# Patient Record
Sex: Female | Born: 1985 | Race: White | Hispanic: No | Marital: Married | Smoking: Former smoker
Health system: Southern US, Community
[De-identification: ages and names within clinical notes are randomized; demographics above are authoritative.]

## PROBLEM LIST (undated history)

## (undated) ENCOUNTER — Inpatient Hospital Stay (HOSPITAL_COMMUNITY): Payer: Self-pay

## (undated) DIAGNOSIS — R87619 Unspecified abnormal cytological findings in specimens from cervix uteri: Secondary | ICD-10-CM

## (undated) DIAGNOSIS — IMO0002 Reserved for concepts with insufficient information to code with codable children: Secondary | ICD-10-CM

## (undated) DIAGNOSIS — Z3490 Encounter for supervision of normal pregnancy, unspecified, unspecified trimester: Principal | ICD-10-CM

## (undated) DIAGNOSIS — D649 Anemia, unspecified: Secondary | ICD-10-CM

## (undated) HISTORY — DX: Encounter for supervision of normal pregnancy, unspecified, unspecified trimester: Z34.90

## (undated) HISTORY — PX: TONSILLECTOMY: SUR1361

## (undated) HISTORY — DX: Unspecified abnormal cytological findings in specimens from cervix uteri: R87.619

## (undated) HISTORY — DX: Reserved for concepts with insufficient information to code with codable children: IMO0002

---

## 2007-10-14 ENCOUNTER — Ambulatory Visit (HOSPITAL_COMMUNITY): Payer: Self-pay | Admitting: Psychiatry

## 2007-10-22 ENCOUNTER — Ambulatory Visit (HOSPITAL_COMMUNITY): Payer: Self-pay | Admitting: Psychiatry

## 2007-10-25 ENCOUNTER — Ambulatory Visit (HOSPITAL_COMMUNITY): Payer: Self-pay | Admitting: Psychology

## 2008-02-07 ENCOUNTER — Ambulatory Visit: Payer: Self-pay | Admitting: Family

## 2008-02-07 ENCOUNTER — Encounter: Payer: Self-pay | Admitting: Family

## 2008-02-28 ENCOUNTER — Ambulatory Visit: Payer: Self-pay | Admitting: Family

## 2008-03-02 ENCOUNTER — Ambulatory Visit (HOSPITAL_COMMUNITY): Admission: RE | Admit: 2008-03-02 | Discharge: 2008-03-02 | Payer: Self-pay | Admitting: Obstetrics & Gynecology

## 2008-03-27 ENCOUNTER — Ambulatory Visit: Payer: Self-pay | Admitting: Obstetrics and Gynecology

## 2008-04-14 ENCOUNTER — Ambulatory Visit: Payer: Self-pay | Admitting: Family

## 2008-04-18 ENCOUNTER — Ambulatory Visit: Payer: Self-pay | Admitting: Family

## 2008-04-25 ENCOUNTER — Ambulatory Visit: Payer: Self-pay | Admitting: Physician Assistant

## 2008-05-10 ENCOUNTER — Ambulatory Visit: Payer: Self-pay | Admitting: Obstetrics & Gynecology

## 2008-05-24 ENCOUNTER — Ambulatory Visit: Payer: Self-pay | Admitting: Obstetrics & Gynecology

## 2008-06-07 ENCOUNTER — Ambulatory Visit: Payer: Self-pay | Admitting: Obstetrics & Gynecology

## 2008-06-12 ENCOUNTER — Inpatient Hospital Stay (HOSPITAL_COMMUNITY): Admission: AD | Admit: 2008-06-12 | Discharge: 2008-06-12 | Payer: Self-pay | Admitting: Obstetrics & Gynecology

## 2008-06-12 ENCOUNTER — Ambulatory Visit: Payer: Self-pay | Admitting: Family

## 2008-06-26 ENCOUNTER — Ambulatory Visit: Payer: Self-pay | Admitting: Family

## 2008-07-10 ENCOUNTER — Ambulatory Visit: Payer: Self-pay | Admitting: Family

## 2008-07-18 ENCOUNTER — Ambulatory Visit: Payer: Self-pay | Admitting: Family

## 2008-07-25 ENCOUNTER — Ambulatory Visit: Payer: Self-pay | Admitting: Obstetrics and Gynecology

## 2008-07-27 ENCOUNTER — Ambulatory Visit: Payer: Self-pay | Admitting: Obstetrics and Gynecology

## 2008-07-27 ENCOUNTER — Inpatient Hospital Stay (HOSPITAL_COMMUNITY): Admission: AD | Admit: 2008-07-27 | Discharge: 2008-07-29 | Payer: Self-pay | Admitting: Physician Assistant

## 2008-09-08 ENCOUNTER — Ambulatory Visit: Payer: Self-pay | Admitting: Physician Assistant

## 2009-02-14 ENCOUNTER — Encounter: Payer: Self-pay | Admitting: Obstetrics & Gynecology

## 2009-02-14 ENCOUNTER — Ambulatory Visit: Payer: Self-pay | Admitting: Obstetrics & Gynecology

## 2010-06-04 ENCOUNTER — Ambulatory Visit: Payer: Self-pay | Admitting: Obstetrics & Gynecology

## 2010-06-05 ENCOUNTER — Encounter: Payer: Self-pay | Admitting: Obstetrics & Gynecology

## 2011-02-04 ENCOUNTER — Ambulatory Visit (INDEPENDENT_AMBULATORY_CARE_PROVIDER_SITE_OTHER): Payer: Managed Care, Other (non HMO) | Admitting: Obstetrics & Gynecology

## 2011-02-04 ENCOUNTER — Other Ambulatory Visit: Payer: Self-pay | Admitting: Obstetrics & Gynecology

## 2011-02-04 DIAGNOSIS — Z113 Encounter for screening for infections with a predominantly sexual mode of transmission: Secondary | ICD-10-CM

## 2011-02-04 DIAGNOSIS — Z1272 Encounter for screening for malignant neoplasm of vagina: Secondary | ICD-10-CM

## 2011-02-04 DIAGNOSIS — Z01419 Encounter for gynecological examination (general) (routine) without abnormal findings: Secondary | ICD-10-CM

## 2011-02-05 ENCOUNTER — Encounter: Payer: Self-pay | Admitting: Obstetrics & Gynecology

## 2011-02-05 LAB — CONVERTED CEMR LAB
Clue Cells Wet Prep HPF POC: NONE SEEN
GC Probe Amp, Genital: NEGATIVE

## 2011-02-11 NOTE — Assessment & Plan Note (Signed)
NAME:  Meagan Campos NO.:  [id number]  MEDICAL RECORD NO.:  [id number]           PATIENT TYPE:  LOCATION:  [facility] [place]           FACILITY:  PHYSICIAN:  Elsie Lincoln, MD           DATE OF BIRTH:  DATE OF SERVICE:  02/04/2011                                 CLINIC NOTE  The patient is a 25 year old female who presents for her annual Pap. The patient has burning after sex for approximately 15 minutes. After we delved into this a little bit, it appears they are having some sexual issues.  She has some problems with erectile dysfunction and then also premature ejaculation.  The patient feels that the erectile dysfunction is due to her not being desirable, so she feels self-conscious and is not lubricating well, though he is inserting his penis she is not lubricating her vagina.  She feels that maybe that there could be some irritation due to that.  They do not use lubrication because she feels like she is 24 and she does not need lubrication. She does not have any vaginal burning other than 15 minutes after sex.  She does not have any vaginal burning currently.  She does not have any vaginal discharge right now.  She would like to be tested for sexually transmitted diseases.  PAST MEDICAL HISTORY:  She had been diagnosed with a benign brain cyst and was having migraine headaches and syncope, but this has cleared up.  PAST SURGICAL HISTORY:  Tonsillectomy.  FAMILY HISTORY:  Positive for breast cancer in her maternal great aunt and ovarian cancer in her maternal grandmother.  SOCIAL HISTORY:  Negative for tobacco, alcohol and drugs.  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  ALLERGIES:  None.  MEDICATIONS:  Mirena.  REVIEW OF SYSTEMS:  Vaginal dryness.  PHYSICAL EXAMINATION:  VITAL SIGNS:  Pulse 77, blood pressure 138/71, weight 121, height 66 inches. GENERAL.  Well-nourished, well-developed, in no apparent  distress. HEENT:  Normocephalic, atraumatic.  Good dentition.  Thyroid no masses. LUNGS:  Clear to auscultation bilaterally. HEART:  Regular rate and rhythm. BREASTS:  No masses, no lymphadenopathy and no nipple discharge. ABDOMEN:  Soft, nontender.  No organomegaly, no hernia. GENITALIA:  Tanner V.  Vagina pink, normal rugae.  Cervix closed, nontender.  Mirena IUD string seen.  Uterus anteverted, nontender. Adnexa no masses, nontender.  No cystocele, no rectocele, no urethral tenderness and small hemorrhoid.  EXTREMITIES:  No edema.  ASSESSMENT AND PLAN:  A 25 year old female for well-woman exam: 1. Vaginal burning after sex probably due to sex secondary to not     lubricating.  The patient encouraged to use Astroglide lubricant.     The patient should not feel undesirable, although the erectile     dysfunction could trip off. 2. Pap smear today and next Pap smear due in 2 years. 3. Continue Mirena. 4. Return to clinic in a year.          ______________________________ Elsie Lincoln, MD    KL/MEDQ  D:  02/04/2011  T:  02/05/2011  Job:  [id number]

## 2011-03-04 ENCOUNTER — Encounter (INDEPENDENT_AMBULATORY_CARE_PROVIDER_SITE_OTHER): Payer: Managed Care, Other (non HMO) | Admitting: Obstetrics & Gynecology

## 2011-03-04 ENCOUNTER — Other Ambulatory Visit: Payer: Self-pay | Admitting: Obstetrics & Gynecology

## 2011-03-04 DIAGNOSIS — R87612 Low grade squamous intraepithelial lesion on cytologic smear of cervix (LGSIL): Secondary | ICD-10-CM

## 2011-03-19 ENCOUNTER — Ambulatory Visit (INDEPENDENT_AMBULATORY_CARE_PROVIDER_SITE_OTHER): Payer: Managed Care, Other (non HMO) | Admitting: Obstetrics & Gynecology

## 2011-03-19 DIAGNOSIS — N870 Mild cervical dysplasia: Secondary | ICD-10-CM

## 2011-03-21 NOTE — Assessment & Plan Note (Signed)
NAME:  KELANI, ROBART NO.:  [id number]  MEDICAL RECORD NO.:  [id number]           PATIENT TYPE:  LOCATION:  [facility] [place]           FACILITY:  PHYSICIAN:  Allie Bossier, MD        DATE OF BIRTH:  05/03/1986  DATE OF SERVICE:  03/19/2011                                 CLINIC NOTE  Meagan Campos is a 25 year old single white gravida 0 who had a Pap smear on February 04, 2011, that showed low-grade dysplasia.  She underwent a colposcopy, endocervical curettage and cervical biopsy on March 04, 2011, by Dr. Penne Lash.  Dr. Bertram Denver impression was low grade dysplasia. 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  I have explained at length Pap smear, cervical cancer and followup based on these findings. I have recommended healthy habits that will promote her immune system and probable regression of this cervical dysplasia.  She understands she will need Pap smears every 6 months.     Allie Bossier, MD    MCD/MEDQ  D:  03/19/2011  T:  03/19/2011  Job:  [id number]

## 2011-03-25 NOTE — Assessment & Plan Note (Signed)
NAME:  Meagan Campos, Meagan Campos NO.:  [id number]   MEDICAL RECORD NO.:  [id number]          PATIENT TYPE:  POB   LOCATION:  [facility] [place]         FACILITY:  [facility]   PHYSICIAN:  Jaynie Collins, MD     DATE OF BIRTH:  [birth year]   DATE OF SERVICE:  06/04/2010                                  CLINIC NOTE   REASON FOR VISIT:  Itchy vaginal discharge and left pelvic pain for the  past 3 days.   HISTORY OF PRESENT ILLNESS:  The patient is a 25 year old gravida 1,  para 1, who comes in today with a complaint of vaginal discharge that is  white, itchy with odor, and left pelvic pain for the past 3 days.  The  patient had used over-the-counter Monistat last week without any relief  of her symptoms.  She wants to be evaluated for possible yeast infection  and also received the appropriate therapy.  She denies any other  gynecologic complaints.   PHYSICAL EXAMINATION:  VITAL SIGNS:  Temperature is 98, pulse 89, blood  pressure 128/71, weight 117 pounds, height 66 inches.  GENERAL:  No apparent distress.  ABDOMEN:  Soft.  The patient does have left lower quadrant tenderness to  deep palpation.  No rebound or guarding.  No suprapubic tenderness.  PELVIC:  Normal external female genitalia.  Pink, well-rugated vagina.  The patient does have a thick yellow discharge with a fishy odor that  was noted.  A sample was taken for wet prep.  No lesions seen.  Normal  cervix with IUD strings visualized.  On bimanual exam, no cervical  motion tenderness.  Small retroverted uterus and normal adnexa  bilaterally.   ASSESSMENT AND PLAN:  The patient is a 25 year old gravida 1, para 1  here for evaluation of her vaginal discharge and left pelvic pain.  The  patient's wet prep is pending.  We will follow up results and treat  accordingly.  The patient was told if her pelvic pain does continue,  that she might need an ultrasound or more evaluation but as of now, we  will just follow up her  workup for vaginitis and treat accordingly.  She  was told to call back if she has any further gynecologic concerns and  was told to also make an appointment for her annual examination.           ______________________________  Jaynie Collins, MD     UA/MEDQ  D:  06/04/2010  T:  06/05/2010  Job:  [id number]

## 2011-03-25 NOTE — Assessment & Plan Note (Signed)
NAME:  Meagan Campos, Meagan Campos NO.:  [id number]   MEDICAL RECORD NO.:  [id number]          PATIENT TYPE:  POB   LOCATION:  [facility] [place]         FACILITY:  [facility]   PHYSICIAN:  Allie Bossier, MD        DATE OF BIRTH:  1986/01/19   DATE OF SERVICE:                                  CLINIC NOTE   Meagan Campos is a 25 year old single white gravida 1, para 1 with a 29-month-  old daughter, named Lexi.  She comes in for her annual exam.  She has no  particular complaints.  She would like to start Micronor oral  contraceptive pills.  She is not currently sexually active and is not  sure that when she well that she would like to start this anyway.   PAST MEDICAL HISTORY:  None.   PAST SURGICAL HISTORY:  Tonsillectomy.   FAMILY HISTORY:  Positive for breast cancer in a maternal great-aunt,  and ovarian cancer in her maternal grandmother.  She denies a family  history of colon cancer.   SOCIAL HISTORY:  Negative for tobacco, alcohol, or drug use.   ALLERGIES:  No known drug allergies.  No latex allergies.   MEDICATIONS:  Multivitamin.   REVIEW OF SYSTEMS:  As above.  She is not currently sexually active.  The father of the baby visits with the child periodically.   PHYSICAL EXAMINATION:  VITAL SIGNS:  Weight 117 pounds, blood pressure  110/64, and pulse 80.  HEENT:  Normal.  HEART:  Regular rate and rhythm.  LUNGS:  Clear to auscultation bilaterally.  BREASTS:  Normal.  Normal breast feeding texture of the breast.  No  abnormal masses or skin changes.  ABDOMEN:  Scaphoid.  No hepatosplenomegaly.  EXTERNAL GENITALIA:  No lesions.  Cervix parous, no lesions.  Normal  discharge.  Uterus normal size, and shape, anteverted, mobile, and  nontender.  Adnexa, nontender, and no masses.   ASSESSMENT AND PLAN:  Annual exam.  Checked a Pap smear.  Recommended  with GC and Chlamydia cultures.  I have recommended self-breast and self  vulvar exams monthly, given her prescription for  Micronor be taken  daily.      Allie Bossier, MD     MCD/MEDQ  D:  02/14/2009  T:  02/14/2009  Job:  [id number]

## 2011-07-04 ENCOUNTER — Ambulatory Visit (INDEPENDENT_AMBULATORY_CARE_PROVIDER_SITE_OTHER): Payer: Managed Care, Other (non HMO) | Admitting: Obstetrics and Gynecology

## 2011-07-04 VITALS — BP 117/76 | HR 71 | Temp 97.0°F | Resp 12 | Wt 120.0 lb

## 2011-07-04 DIAGNOSIS — N390 Urinary tract infection, site not specified: Secondary | ICD-10-CM | POA: Insufficient documentation

## 2011-07-04 LAB — POCT URINALYSIS DIPSTICK
Ketones, UA: NEGATIVE
Nitrite, UA: NEGATIVE
pH, UA: 7

## 2011-07-04 MED ORDER — SULFAMETHOXAZOLE-TRIMETHOPRIM 800-160 MG PO TABS: 1.0000 | ORAL_TABLET | Freq: Two times a day (BID) | ORAL | Status: AC

## 2011-07-04 NOTE — Progress Notes (Signed)
SUBJECTIVE: 25 yo G2P0010 with 3 day hx dysuria, frequency, urgency. Had gross hematuria 2 days ago. Taking Cystex without resolution of sx. Had mid bil back pain 2 nights ago, none at present. Denies fever, chills, nausea/vomiting. Had recent new sexual partner. Has IUD. Hx. of UTIs in past,   OBJECTIVE: Filed Vitals:   07/04/11 1102  BP: 117/76  Pulse: 71  Temp: 97 F (36.1 C)  Resp: 12   Results for orders placed in visit on 07/04/11 (from the past 24 hour(s))  POCT URINALYSIS DIPSTICK     Status: Abnormal   Collection Time   07/04/11 11:11 AM      Component Value Range   Color, UA yellow     Clarity, UA cloudy     Glucose, UA neg     Bilirubin, UA neg     Ketones, UA neg     Spec Grav, UA 1.005     Blood, UA large     pH, UA 7.0     Protein, UA trace     Urobilinogen, UA normal     Nitrite, UA neg     Leukocytes, UA large      Back: no CVAT  ASSESSMENT: Hemorrhagic cystitis  PLAN: Rx Bactrim DS bid x 3 days. and will continue Cystex prn. Culture sent. Reviewed preventiive measures and advised increase fluids. Call if no improvement after 24 hrs on antibiotic.

## 2011-07-07 ENCOUNTER — Ambulatory Visit (INDEPENDENT_AMBULATORY_CARE_PROVIDER_SITE_OTHER): Payer: Managed Care, Other (non HMO) | Admitting: Physician Assistant

## 2011-07-07 VITALS — BP 110/62 | HR 80 | Temp 98.0°F | Resp 16 | Ht 65.0 in | Wt 120.0 lb

## 2011-07-07 DIAGNOSIS — N390 Urinary tract infection, site not specified: Secondary | ICD-10-CM

## 2011-07-07 MED ORDER — FLUCONAZOLE 100 MG PO TABS: 200.0000 mg | ORAL_TABLET | Freq: Every day | ORAL | Status: AC

## 2011-07-07 MED ORDER — CIPROFLOXACIN HCL 500 MG PO TABS: 500.0000 mg | ORAL_TABLET | Freq: Two times a day (BID) | ORAL | Status: AC

## 2011-07-17 NOTE — Progress Notes (Signed)
Chief Complaint:  Urinary Tract Infection   Meagan Campos is  25 y.o. G2P0010.  No LMP recorded. Patient is not currently having periods (Reason: IUD)..    Obstetrical/Gynecological History: OB History    Grav Para Term Preterm Abortions TAB SAB Ect Mult Living   2 1   1            Past Medical History: Past Medical History  Diagnosis Date  . Migraine     Past Surgical History: Past Surgical History  Procedure Date  . Tonsillectomy age 26    Family History: Family History  Problem Relation Age of Onset  . Cancer Maternal Grandmother     ovarian  . Cancer Paternal Grandmother     lung  . Cancer Paternal Grandfather     colon, prostate    Social History: History  Substance Use Topics  . Smoking status: Former Smoker    Quit date: 11/10/2009  . Smokeless tobacco: Never Used  . Alcohol Use: No     rare     Allergies: No Known Allergies   (Not in a hospital admission)  Review of Systems - Negative except what has been reviewed in the HPI  Physical Exam   Blood pressure 110/62, pulse 80, temperature 98 F (36.7 C), temperature source Oral, resp. rate 16, height 5\' 5"  (1.651 m), weight 54.432 kg (120 lb).  General: General appearance - alert, well appearing, and in no distress, oriented to person, place, and time and normal appearing weight Mental status - alert, oriented to person, place, and time, normal mood, behavior, speech, dress, motor activity, and thought processes Abdomen - soft, nontender, nondistended, no masses or organomegaly no CVA tenderness Focused Gynecological Exam: examination not indicated  Labs:   Assessment: Patient Active Problem List  Diagnoses  . UTI (lower urinary tract infection)    Plan: Rx send to pharmacy for ABX  Rjay Revolorio E. 07/17/2011,4:37 PM

## 2011-08-08 LAB — COMPREHENSIVE METABOLIC PANEL
Alkaline Phosphatase: 71
BUN: 5 — ABNORMAL LOW
CO2: 24
Chloride: 102
GFR calc non Af Amer: >60
Glucose, Bld: 78
Potassium: 3.6
Total Bilirubin: 0.5
Total Protein: 6.1

## 2011-08-08 LAB — CBC
HCT: 34.2 — ABNORMAL LOW
Hemoglobin: 11.3 — ABNORMAL LOW
RBC: 3.83 — ABNORMAL LOW
RDW: 12.8

## 2011-08-08 LAB — URINALYSIS, ROUTINE W REFLEX MICROSCOPIC
Glucose, UA: NEGATIVE
Ketones, ur: NEGATIVE
pH: 6

## 2011-08-11 LAB — CBC
MCHC: 33.2
MCV: 83.8
Platelets: 196
RBC: 3.25 — ABNORMAL LOW
RBC: 4.01
RDW: 13.7
WBC: 7.8
WBC: 9.2

## 2011-08-11 LAB — RPR: RPR Ser Ql: NONREACTIVE

## 2011-09-30 ENCOUNTER — Ambulatory Visit (INDEPENDENT_AMBULATORY_CARE_PROVIDER_SITE_OTHER): Payer: Managed Care, Other (non HMO) | Admitting: Obstetrics & Gynecology

## 2011-09-30 ENCOUNTER — Other Ambulatory Visit (HOSPITAL_COMMUNITY)
Admission: RE | Admit: 2011-09-30 | Discharge: 2011-09-30 | Disposition: A | Discharge status: Home / Self Care | Payer: Managed Care, Other (non HMO) | Source: Ambulatory Visit | Attending: Obstetrics & Gynecology | Admitting: Obstetrics & Gynecology

## 2011-09-30 DIAGNOSIS — Z01419 Encounter for gynecological examination (general) (routine) without abnormal findings: Secondary | ICD-10-CM | POA: Insufficient documentation

## 2011-09-30 DIAGNOSIS — Z30432 Encounter for removal of intrauterine contraceptive device: Secondary | ICD-10-CM

## 2011-09-30 DIAGNOSIS — Z309 Encounter for contraceptive management, unspecified: Secondary | ICD-10-CM

## 2011-09-30 DIAGNOSIS — Z23 Encounter for immunization: Secondary | ICD-10-CM

## 2011-09-30 DIAGNOSIS — Z0000 Encounter for general adult medical examination without abnormal findings: Secondary | ICD-10-CM

## 2011-09-30 DIAGNOSIS — Z1272 Encounter for screening for malignant neoplasm of vagina: Secondary | ICD-10-CM

## 2011-09-30 DIAGNOSIS — N870 Mild cervical dysplasia: Secondary | ICD-10-CM | POA: Insufficient documentation

## 2011-09-30 DIAGNOSIS — Z113 Encounter for screening for infections with a predominantly sexual mode of transmission: Secondary | ICD-10-CM

## 2011-09-30 MED ORDER — NORETHIN ACE-ETH ESTRAD-FE 1-20 MG-MCG(24) PO TABS: 1.0000 | ORAL_TABLET | Freq: Every day | ORAL | Status: DC

## 2011-09-30 MED ORDER — INFLUENZA VIRUS VACC SPLIT PF IM SUSP: 0.5000 mL | Freq: Once | INTRAMUSCULAR | Status: DC

## 2011-09-30 NOTE — Progress Notes (Signed)
History:  25 y.o. G2P0010 here today for Mirena IUD removal secondary to side effects of migraines, pelvic pain.  Desires OCPs for birth control.  Patient also has history of LGSIL pap and colposcopy showed CIN1 in 02/2011.  Followup pap smear also to be done today. Patient also complains of palpating left breast lump a few weeks ago, not recently.  Wants to be evaluated.  Of note, she has a FH of breast cancer in her grandmother and great-grandmother, and she as personally tested negative for BRCA mutations.  The following portions of the patient's history were reviewed and updated as appropriate: allergies, current medications, past family history, past medical history, past social history, past surgical history and problem list.   Objective:  Physical Exam Blood pressure 139/81, pulse 78, temperature 98.4 F (36.9 C), temperature source Oral, resp. rate 16, height 5\' 5"  (1.651 m), weight 120 lb (54.432 kg). Gen: NAD Breast: Normal exam, no masses, skin changes, nipple drainage or lymphadenopathy noted. Abd: Soft, nontender and nondistended Pelvic: Normal appearing external genitalia; normal appearing vaginal mucosa.  Normal discharge. Cervix with ? 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  Pap obtained.  Strings of the IUD were noted, grasped and pulled using ring forceps.  The IUD was successfully removed in its entirety.  Patient tolerated the procedure well. Small uterus, no palpable masses or adnexal tenderness.  Assessment & Plan:  Mirena IUD removed, Loestrin 1/20 prescribed for contraception. Patient to observe for any concerning side effects, she has taken OCPs in the past without complication. Normal breast exam, patient reassured.  Flu shot given today as per patient preference. Follow up pap smear.  Patient needs pap smears every six months until she has two consecutive normal pap smears, then she can resume annual screening. If any pap smear is abnormal, she will need repeat  colposcopy and appropriate evaluation.

## 2011-09-30 NOTE — Patient Instructions (Addendum)

## 2011-10-21 ENCOUNTER — Ambulatory Visit (INDEPENDENT_AMBULATORY_CARE_PROVIDER_SITE_OTHER): Payer: Managed Care, Other (non HMO) | Admitting: Obstetrics & Gynecology

## 2011-10-21 VITALS — BP 123/73 | HR 85 | Temp 97.7°F | Resp 16 | Ht 64.0 in | Wt 118.0 lb

## 2011-10-21 DIAGNOSIS — R87612 Low grade squamous intraepithelial lesion on cytologic smear of cervix (LGSIL): Secondary | ICD-10-CM

## 2011-10-21 NOTE — Progress Notes (Signed)
  Subjective:    Patient ID: Meagan Campos, female    DOB: Feb 20, 1986, 25 y.o.   MRN: [id number]  HPI  Meagan Campos is here to have a colposcopy. She had a pap smear in the spring of 2012 that showed LGSIL. A colpo was done in 4/12 and ECC was negative. Biopsies showed CIN 1.  She had a pap 11/21 that showed LGSIL.  Review of Systems     Objective:   Physical Exam        Assessment & Plan:   I have told Meagan Campos that I am more than willing to do another colposcopy but that I don't feel it is absolutely necessary as long as her pap smears stay at LGSIL or less.  She agrees to get another pap in May 2013. I have recommended a daily MVI as well as continuing not smoking and rest and decreased stress.

## 2012-01-09 ENCOUNTER — Other Ambulatory Visit: Payer: Self-pay | Admitting: Advanced Practice Midwife

## 2012-01-09 ENCOUNTER — Encounter: Payer: Self-pay | Admitting: Physician Assistant

## 2012-01-09 ENCOUNTER — Ambulatory Visit (INDEPENDENT_AMBULATORY_CARE_PROVIDER_SITE_OTHER): Payer: Managed Care, Other (non HMO) | Admitting: Physician Assistant

## 2012-01-09 VITALS — BP 101/57 | HR 74 | Temp 97.5°F | Resp 16 | Ht 65.0 in | Wt 118.0 lb

## 2012-01-09 DIAGNOSIS — L738 Other specified follicular disorders: Secondary | ICD-10-CM

## 2012-01-09 DIAGNOSIS — L739 Follicular disorder, unspecified: Secondary | ICD-10-CM

## 2012-01-09 NOTE — Patient Instructions (Signed)
Folliculitis      Folliculitis is an infection and inflammation of the hair follicles. Hair follicles become red and irritated. This inflammation is usually caused by bacteria. The bacteria thrive in warm, moist environments. This condition can be seen anywhere on the body.   CAUSES  The most common cause of folliculitis is an infection by germs (bacteria). Fungal and viral infections can also cause the condition. Viral infections may be more common in people whose bodies are unable to fight disease well (weakened immune systems). Examples include people with:  · AIDS.   · An organ transplant.   · Cancer.   People with depressed immune systems, diabetes, or obesity, have a greater risk of getting folliculitis than the general population. Certain chemicals, especially oils and tars, also can cause folliculitis.  SYMPTOMS  · An early sign of folliculitis is a small, white or yellow pus-filled, itchy lesion (pustule). These lesions appear on a red, inflamed follicle. They are usually less than 5 mm (.20 inches).   · The most likely starting points are the scalp, thighs, legs, back and buttocks. Folliculitis is also frequently found in areas of repeated shaving.   · When an infection of the follicle goes deeper, it becomes a boil or furuncle. A group of closely packed boils create a larger lesion (a carbuncle). These sores (lesions) tend to occur in hairy, sweaty areas of the body.   TREATMENT   · A doctor who specializes in skin problems (dermatologists) treats mild cases of folliculitis with antiseptic washes.   · They also use a skin application which kills germs (topical antibiotics). Tea tree oil is a good topical antiseptic as well. It can be found at a health food store. A small percentage of individuals may develop an allergy to the tea tree oil.   · Mild to moderate boils respond well to warm water compresses applied three times daily.   · In some cases, oral antibiotics should be taken with the skin treatment.    · If lesions contain large quantities of pus or fluid, your caregiver may drain them. This allows the topical antibiotics to get to the affected areas better.   · Stubborn cases of folliculitis may respond to laser hair removal. This process uses a high intensity light beam (a laser) to destroy the follicle and reduces the scarring from folliculitis. After laser hair removal, hair will no longer grow in the laser treated area.   Patients with long-lasting folliculitis need to find out where the infection is coming from. Germs can live in the nostrils of the patient. This can trigger an outbreak now and then. Sometimes the bacteria live in the nostrils of a family member. This person does not develop the disorder but they repeatedly re-expose others to the germ. To break the cycle of recurrence in the patient, the family member must also undergo treatment.  PREVENTION   · Individuals who are predisposed to folliculitis should be extremely careful about personal hygiene.   · Application of antiseptic washes may help prevent recurrences.   · A topical antibiotic cream, mupirocin (Bactroban®), has been effective at reducing bacteria in the nostrils. It is applied inside the nose with your little finger. This is done twice daily for a week. Then it is repeated every 6 months.   · Because follicle disorders tend to come back, patients must receive follow-up care. Your caregiver may be able to recognize a recurrence before it becomes severe.   SEEK IMMEDIATE MEDICAL CARE   IF:   · You develop redness, swelling, or increasing pain in the area.   · You have a fever.   · You are not improving with treatment or are getting worse.   · You have any other questions or concerns.   Document Released: 01/05/2002 Document Revised: 07/09/2011 Document Reviewed: 11/01/2008  Exit[employer]® Patient Information ©2012 [employer].

## 2012-01-09 NOTE — Progress Notes (Signed)
  Subjective:    Patient ID: Meagan Campos, female    DOB: 14-Dec-1985, 26 y.o.   MRN: [id number]  HPI Pt reports developing multiple red raised bumps on her labia over the past week. She has had a few before. She is worried that they may be genital warts. They are mildly tender but not painful or burning and have not been vesicular.    Review of Systems: Otherwise neg     Objective:   Physical Exam  Constitutional: She appears well-developed and well-nourished. No distress.  Cardiovascular: Normal rate.   Pulmonary/Chest: Effort normal.  Abdominal: Soft. There is no tenderness.  Genitourinary: There is lesion on the right labia. There is no tenderness on the right labia. There is lesion on the left labia. There is no tenderness on the left labia.       20-30 1-3 mm red flat and raised lesions. Most in healing stage. Few w/ white head. One de-roofed, specimen collected for HVS culture.   Lymphadenopathy:       Left: No inguinal adenopathy present.       Assessment & Plan:  Folliculitis  Avoid shaving w/ razor. Use electric instead. F/U PRN for worsening Sx Recommend soaks to drain them in the future PRN HSV culture   Dorathy Kinsman 01/09/2012 9:14 AM

## 2012-01-15 LAB — HERPES SIMPLEX VIRUS CULTURE: Organism ID, Bacteria: NOT DETECTED

## 2012-01-16 ENCOUNTER — Telehealth: Payer: Self-pay | Admitting: *Deleted

## 2012-01-16 NOTE — Telephone Encounter (Signed)
Pt notified of neg HSV culture 

## 2012-01-27 ENCOUNTER — Encounter: Payer: Self-pay | Admitting: Obstetrics & Gynecology

## 2012-01-27 ENCOUNTER — Ambulatory Visit (INDEPENDENT_AMBULATORY_CARE_PROVIDER_SITE_OTHER): Payer: Managed Care, Other (non HMO) | Admitting: Obstetrics & Gynecology

## 2012-01-27 VITALS — BP 125/75 | HR 75 | Temp 97.4°F | Ht 64.0 in | Wt 120.1 lb

## 2012-01-27 DIAGNOSIS — R1032 Left lower quadrant pain: Secondary | ICD-10-CM

## 2012-01-27 DIAGNOSIS — N643 Galactorrhea not associated with childbirth: Secondary | ICD-10-CM

## 2012-01-27 NOTE — Progress Notes (Signed)
  Subjective:    Patient ID: Meagan Campos, female    DOB: October 09, 1986, 26 y.o.   MRN: [id number]  HPI  Meagan Campos is a 26 yo non-pregnant lady who comes in with the complaint of galactorrhea that she discovered with nipple stimulation with husband. She breastfed for a year, ending in 2010. She had one occasion in 2012 of seeing colostrum like liquid from her breast but that resolved. This issue has now returned. She also complains of some LLQ tenderness. Review of Systems    UPT negative today, on no meds to explain the galactorhea. Objective:   Physical Exam Normal breast exam, I was able to extract milk from her left nipple, no masses in breasts Normal pelvic exam       Assessment & Plan:  Galactorrhea- check TSH, AM prolactin

## 2012-01-28 ENCOUNTER — Other Ambulatory Visit (INDEPENDENT_AMBULATORY_CARE_PROVIDER_SITE_OTHER): Payer: Managed Care, Other (non HMO)

## 2012-01-28 DIAGNOSIS — N643 Galactorrhea not associated with childbirth: Secondary | ICD-10-CM

## 2012-01-28 NOTE — Progress Notes (Signed)
PRL and TSH levels drawn per Dr Marice Potter.

## 2012-07-01 ENCOUNTER — Encounter: Payer: Self-pay | Admitting: Obstetrics & Gynecology

## 2012-07-01 ENCOUNTER — Ambulatory Visit (INDEPENDENT_AMBULATORY_CARE_PROVIDER_SITE_OTHER): Payer: Managed Care, Other (non HMO) | Admitting: Obstetrics & Gynecology

## 2012-07-01 VITALS — BP 116/77 | Wt 120.0 lb

## 2012-07-01 DIAGNOSIS — Z3490 Encounter for supervision of normal pregnancy, unspecified, unspecified trimester: Secondary | ICD-10-CM

## 2012-07-01 DIAGNOSIS — Z124 Encounter for screening for malignant neoplasm of cervix: Secondary | ICD-10-CM

## 2012-07-01 DIAGNOSIS — Z113 Encounter for screening for infections with a predominantly sexual mode of transmission: Secondary | ICD-10-CM

## 2012-07-01 DIAGNOSIS — Z3480 Encounter for supervision of other normal pregnancy, unspecified trimester: Secondary | ICD-10-CM

## 2012-07-01 HISTORY — DX: Encounter for supervision of normal pregnancy, unspecified, unspecified trimester: Z34.90

## 2012-07-01 NOTE — Addendum Note (Signed)
Addended by: Barbara Cower on: 07/01/2012 03:00 PM   Modules accepted: Orders

## 2012-07-01 NOTE — Progress Notes (Signed)
   Subjective:    Unnamed Hino is a [id number] [redacted]w[redacted]d being seen today for her first obstetrical visit.  Her obstetrical history is significant for nonee. Patient does intend to breast feed. Pregnancy history fully reviewed.  Patient reports no complaints.  Filed Vitals:   07/01/12 1306  BP: 116/77  Weight: 120 lb (54.432 kg)    HISTORY: OB History    Grav Para Term Preterm Abortions TAB SAB Ect Mult Living   3 1 1  1     1      # Outc Date GA Lbr Len/2nd Wgt Sex Del Anes PTL Lv   1 TRM  [redacted]w[redacted]d  7lb5oz(3.317kg) F SVD EPI  Yes   2 ABT            3 CUR              Past Medical History  Diagnosis Date  . Migraine   . Abnormal Pap smear    Past Surgical History  Procedure Date  . Tonsillectomy age 69   Family History  Problem Relation Age of Onset  . Cancer Maternal Grandmother     ovarian  . Cancer Paternal Grandmother     lung  . Cancer Paternal Grandfather     colon, prostate     Exam    Uterus:     Pelvic Exam:    Perineum: No Hemorrhoids   Vulva: normal   Vagina:  normal mucosa   pH: n/a   Cervix: no lesions   Adnexa: normal adnexa   Bony Pelvis: average  System: Breast:  normal appearance, no masses or tenderness   Skin: normal coloration and turgor, no rashes    Neurologic: oriented, normal mood   Extremities: no deformities, no erythema, induration, or nodules   HEENT sclera clear, anicteric, oropharynx clear, no lesions, neck supple with midline trachea and thyroid without masses   Mouth/Teeth mucous membranes moist, pharynx normal without lesions and dental hygiene good   Neck supple and no masses   Cardiovascular: regular rate and rhythm   Respiratory:  appears well, vitals normal, no respiratory distress, acyanotic, normal RR, chest clear, no wheezing, crepitations, rhonchi, normal symmetric air entry   Abdomen: soft, non-tender; bowel sounds normal; no masses,  no organomegaly   Urinary: urethral meatus normal      Assessment:    Pregnancy: [id number] Patient Active Problem List  Diagnosis  . UTI (lower urinary tract infection)  . CIN I (cervical intraepithelial neoplasia I)  . Perineal folliculitis        Plan:     Initial labs drawn. Prenatal vitamins. Problem list reviewed and updated. Genetic Screening discussed First Screen: ordered.  Ultrasound discussed; fetal survey: Will order at next visit.  Follow up in 4 weeks. Bedside US shows single IUP with FH Prenatal vitamins   LEGGETT,KELLY H. 07/01/2012

## 2012-07-01 NOTE — Progress Notes (Signed)
New OB first visit.  Last pap done in November.

## 2012-07-02 LAB — OBSTETRIC PANEL
Antibody Screen: NEGATIVE
Basophils Absolute: 0 10*3/uL (ref 0.0–0.1)
Eosinophils Absolute: 0.1 10*3/uL (ref 0.0–0.7)
Eosinophils Relative: 2 % (ref 0–5)
HCT: 38.7 % (ref 36.0–46.0)
Lymphocytes Relative: 25 % (ref 12–46)
MCH: 29.5 pg (ref 26.0–34.0)
MCV: 90 fL (ref 78.0–100.0)
Monocytes Absolute: 0.4 10*3/uL (ref 0.1–1.0)
RDW: 13.2 % (ref 11.5–15.5)
Rubella: 34.2 IU/mL — ABNORMAL HIGH
WBC: 6.7 10*3/uL (ref 4.0–10.5)

## 2012-07-02 LAB — HIV ANTIBODY (ROUTINE TESTING W REFLEX): HIV: NONREACTIVE

## 2012-07-03 LAB — URINE CULTURE

## 2012-07-05 ENCOUNTER — Encounter: Payer: Self-pay | Admitting: Obstetrics & Gynecology

## 2012-07-05 DIAGNOSIS — Z6791 Unspecified blood type, Rh negative: Secondary | ICD-10-CM

## 2012-07-05 DIAGNOSIS — O26899 Other specified pregnancy related conditions, unspecified trimester: Secondary | ICD-10-CM | POA: Insufficient documentation

## 2012-07-06 LAB — CYSTIC FIBROSIS DIAGNOSTIC STUDY: Date of Birth: [birth year]

## 2012-07-13 ENCOUNTER — Other Ambulatory Visit: Payer: Self-pay | Admitting: Obstetrics & Gynecology

## 2012-07-13 DIAGNOSIS — O99019 Anemia complicating pregnancy, unspecified trimester: Secondary | ICD-10-CM

## 2012-07-13 DIAGNOSIS — N870 Mild cervical dysplasia: Secondary | ICD-10-CM

## 2012-07-14 ENCOUNTER — Telehealth: Payer: Self-pay | Admitting: *Deleted

## 2012-07-14 ENCOUNTER — Other Ambulatory Visit: Payer: Self-pay | Admitting: *Deleted

## 2012-07-14 DIAGNOSIS — O99019 Anemia complicating pregnancy, unspecified trimester: Secondary | ICD-10-CM

## 2012-07-14 NOTE — Telephone Encounter (Signed)
LM on cell phone that we would do her Colpo @ 9/19 with Dr Penne Lash and that her HGB was low and that she needs some additional labs done.  She can either come before work or get them when she comes in for Colpo.  She is to let me know.

## 2012-07-15 ENCOUNTER — Telehealth: Payer: Self-pay | Admitting: *Deleted

## 2012-07-15 MED ORDER — FERROUS SULFATE 325 (65 FE) MG PO TABS: 325.0000 mg | ORAL_TABLET | Freq: Every day | ORAL | Status: DC

## 2012-07-15 MED ORDER — DOCUSATE SODIUM 100 MG PO CAPS: 100.0000 mg | ORAL_CAPSULE | Freq: Two times a day (BID) | ORAL | Status: DC

## 2012-07-15 NOTE — Progress Notes (Signed)
Pt anemic with ferritin of 16.  Will start iron and colace.

## 2012-07-15 NOTE — Addendum Note (Signed)
Addended by: Lesly Dukes on: 07/15/2012 01:42 PM   Modules accepted: Orders

## 2012-07-15 NOTE — Telephone Encounter (Signed)
Lm on pt's voice mail that she indeed does have anemia and needs to start on either Integra or FeSO4 325 mg daily plus Colace daily.

## 2012-07-29 ENCOUNTER — Encounter (HOSPITAL_COMMUNITY): Payer: Self-pay | Admitting: Obstetrics & Gynecology

## 2012-07-29 ENCOUNTER — Ambulatory Visit (INDEPENDENT_AMBULATORY_CARE_PROVIDER_SITE_OTHER): Payer: Managed Care, Other (non HMO) | Admitting: Obstetrics & Gynecology

## 2012-07-29 ENCOUNTER — Encounter: Payer: Self-pay | Admitting: Obstetrics & Gynecology

## 2012-07-29 ENCOUNTER — Ambulatory Visit: Payer: Managed Care, Other (non HMO) | Admitting: Obstetrics & Gynecology

## 2012-07-29 VITALS — BP 127/78 | HR 81 | Ht 64.0 in | Wt 122.0 lb

## 2012-07-29 DIAGNOSIS — R87612 Low grade squamous intraepithelial lesion on cytologic smear of cervix (LGSIL): Secondary | ICD-10-CM

## 2012-07-29 DIAGNOSIS — Z3480 Encounter for supervision of other normal pregnancy, unspecified trimester: Secondary | ICD-10-CM

## 2012-07-29 DIAGNOSIS — Z3490 Encounter for supervision of normal pregnancy, unspecified, unspecified trimester: Secondary | ICD-10-CM

## 2012-07-29 DIAGNOSIS — D649 Anemia, unspecified: Secondary | ICD-10-CM

## 2012-07-29 DIAGNOSIS — Z23 Encounter for immunization: Secondary | ICD-10-CM

## 2012-07-29 NOTE — Progress Notes (Signed)
  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 The prior pap showed low-grade squamous intraepithelial neoplasia (LGSIL - encompassing HPV,mild dysplasia,CIN I).  Prior cervical/vaginal disease: HPV related changes. Prior cervical treatment: repap with HPV post partum.  Procedure Details  The risks and benefits of the procedure and Written informed consent obtained.  Speculum placed in vagina and excellent visualization of cervix achieved, cervix swabbed x 3 with acetic acid solution.  Findings: Cervix: no visible lesions; SCJ visualized 360 degrees without lesions and no biopsies taken. Vaginal inspection: normal without visible lesions. Vulvar colposcopy: vulvar colposcopy not performed.  Specimens: none  Complications: none.  Plan: Repap post partum with co testing  First Screen ordered. FH heard. BP and UA nml. RTC in 4 weeks for routine OB

## 2012-07-30 ENCOUNTER — Other Ambulatory Visit: Payer: Self-pay | Admitting: Obstetrics & Gynecology

## 2012-07-30 DIAGNOSIS — Z3682 Encounter for antenatal screening for nuchal translucency: Secondary | ICD-10-CM

## 2012-07-30 LAB — FOLATE: Folate: >20 ng/mL

## 2012-08-03 ENCOUNTER — Ambulatory Visit (HOSPITAL_COMMUNITY)
Admission: RE | Admit: 2012-08-03 | Discharge: 2012-08-03 | Disposition: A | Discharge status: Home / Self Care | Payer: Managed Care, Other (non HMO) | Source: Ambulatory Visit | Attending: Obstetrics & Gynecology | Admitting: Obstetrics & Gynecology

## 2012-08-03 ENCOUNTER — Encounter: Payer: Self-pay | Admitting: Obstetrics & Gynecology

## 2012-08-03 ENCOUNTER — Encounter (HOSPITAL_COMMUNITY): Payer: Self-pay

## 2012-08-03 ENCOUNTER — Other Ambulatory Visit: Payer: Self-pay

## 2012-08-03 VITALS — BP 125/71 | HR 83 | Wt 124.0 lb

## 2012-08-03 DIAGNOSIS — O3510X0 Maternal care for (suspected) chromosomal abnormality in fetus, unspecified, not applicable or unspecified: Secondary | ICD-10-CM | POA: Insufficient documentation

## 2012-08-03 DIAGNOSIS — Z3689 Encounter for other specified antenatal screening: Secondary | ICD-10-CM | POA: Insufficient documentation

## 2012-08-03 DIAGNOSIS — O99019 Anemia complicating pregnancy, unspecified trimester: Secondary | ICD-10-CM

## 2012-08-03 DIAGNOSIS — Z6791 Unspecified blood type, Rh negative: Secondary | ICD-10-CM

## 2012-08-03 DIAGNOSIS — O351XX0 Maternal care for (suspected) chromosomal abnormality in fetus, not applicable or unspecified: Secondary | ICD-10-CM | POA: Insufficient documentation

## 2012-08-03 DIAGNOSIS — Z3682 Encounter for antenatal screening for nuchal translucency: Secondary | ICD-10-CM

## 2012-08-17 ENCOUNTER — Ambulatory Visit (INDEPENDENT_AMBULATORY_CARE_PROVIDER_SITE_OTHER): Payer: Managed Care, Other (non HMO) | Admitting: Obstetrics & Gynecology

## 2012-08-17 VITALS — BP 126/75 | Temp 97.4°F | Wt 125.0 lb

## 2012-08-17 DIAGNOSIS — O360990 Maternal care for other rhesus isoimmunization, unspecified trimester, not applicable or unspecified: Secondary | ICD-10-CM

## 2012-08-17 DIAGNOSIS — O26899 Other specified pregnancy related conditions, unspecified trimester: Secondary | ICD-10-CM

## 2012-08-17 DIAGNOSIS — Z6791 Unspecified blood type, Rh negative: Secondary | ICD-10-CM

## 2012-08-17 DIAGNOSIS — Z3490 Encounter for supervision of normal pregnancy, unspecified, unspecified trimester: Secondary | ICD-10-CM

## 2012-08-17 DIAGNOSIS — Z3480 Encounter for supervision of other normal pregnancy, unspecified trimester: Secondary | ICD-10-CM

## 2012-08-17 DIAGNOSIS — O209 Hemorrhage in early pregnancy, unspecified: Secondary | ICD-10-CM

## 2012-08-17 NOTE — Patient Instructions (Addendum)
Return to clinic for any obstetric concerns or go to ED for evaluation

## 2012-08-17 NOTE — Progress Notes (Signed)
p-89  Pt having some light spotting and cramping.  She did have intercourse within the last 24 hrs.

## 2012-08-17 NOTE — Progress Notes (Signed)
Patient seen today for report of spotting and cramping after intercourse. No active bleeding, no LOF.  On bedside ultrasound, +FHT and +FM, nml AFV.  On exam, cervix is closed/long, no evidence of old/new blood around cervix or in vagina.  Patient reassured, Rhogam not indicated today.  Bleeding, PTL precautions reviewed. Anatomy ultrasound ordered. MSAFP draw next visit.

## 2012-08-26 ENCOUNTER — Encounter: Payer: Self-pay | Admitting: Obstetrics & Gynecology

## 2012-08-26 ENCOUNTER — Ambulatory Visit (INDEPENDENT_AMBULATORY_CARE_PROVIDER_SITE_OTHER): Payer: Managed Care, Other (non HMO) | Admitting: Obstetrics & Gynecology

## 2012-08-26 VITALS — BP 115/69 | Temp 98.0°F | Wt 126.0 lb

## 2012-08-26 DIAGNOSIS — Z3490 Encounter for supervision of normal pregnancy, unspecified, unspecified trimester: Secondary | ICD-10-CM

## 2012-08-26 DIAGNOSIS — Z3480 Encounter for supervision of other normal pregnancy, unspecified trimester: Secondary | ICD-10-CM

## 2012-08-26 DIAGNOSIS — Z23 Encounter for immunization: Secondary | ICD-10-CM

## 2012-08-26 NOTE — Progress Notes (Signed)
Routine visit. MSAFP today. Flu shot today. No problems. Anatomy u/s scheduled in 2 weeks.

## 2012-08-26 NOTE — Progress Notes (Signed)
p-71  Having some pain in Lt hip @ night.  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

## 2012-09-07 ENCOUNTER — Encounter: Payer: Self-pay | Admitting: *Deleted

## 2012-09-09 ENCOUNTER — Encounter: Payer: Self-pay | Admitting: Obstetrics & Gynecology

## 2012-09-16 ENCOUNTER — Ambulatory Visit (INDEPENDENT_AMBULATORY_CARE_PROVIDER_SITE_OTHER): Payer: Managed Care, Other (non HMO) | Admitting: Obstetrics & Gynecology

## 2012-09-16 ENCOUNTER — Encounter: Payer: Self-pay | Admitting: Obstetrics & Gynecology

## 2012-09-16 ENCOUNTER — Encounter: Payer: Self-pay | Admitting: *Deleted

## 2012-09-16 VITALS — BP 124/76 | Temp 97.0°F | Wt 128.0 lb

## 2012-09-16 DIAGNOSIS — D649 Anemia, unspecified: Secondary | ICD-10-CM

## 2012-09-16 DIAGNOSIS — Z3490 Encounter for supervision of normal pregnancy, unspecified, unspecified trimester: Secondary | ICD-10-CM

## 2012-09-16 DIAGNOSIS — R42 Dizziness and giddiness: Secondary | ICD-10-CM

## 2012-09-16 DIAGNOSIS — Z3480 Encounter for supervision of other normal pregnancy, unspecified trimester: Secondary | ICD-10-CM

## 2012-09-16 DIAGNOSIS — O99019 Anemia complicating pregnancy, unspecified trimester: Secondary | ICD-10-CM

## 2012-09-16 NOTE — Progress Notes (Signed)
p-88  Ketones- trace

## 2012-09-16 NOTE — Progress Notes (Signed)
Work in visit today because she felt lightheaded at work and "almost passed out". She denies VB, ROM, CTXs. I will check labs today. 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

## 2012-09-17 LAB — COMPREHENSIVE METABOLIC PANEL
ALT: 8 U/L (ref 0–35)
Albumin: 3.6 g/dL (ref 3.5–5.2)
Alkaline Phosphatase: 36 U/L — ABNORMAL LOW (ref 39–117)
Glucose, Bld: 73 mg/dL (ref 70–99)
Potassium: 4.2 mEq/L (ref 3.5–5.3)
Sodium: 139 mEq/L (ref 135–145)
Total Bilirubin: 0.3 mg/dL (ref 0.3–1.2)
Total Protein: 5.9 g/dL — ABNORMAL LOW (ref 6.0–8.3)

## 2012-09-21 ENCOUNTER — Ambulatory Visit (HOSPITAL_COMMUNITY)
Admission: RE | Admit: 2012-09-21 | Discharge: 2012-09-21 | Disposition: A | Discharge status: Home / Self Care | Payer: Managed Care, Other (non HMO) | Source: Ambulatory Visit | Attending: Obstetrics & Gynecology | Admitting: Obstetrics & Gynecology

## 2012-09-21 DIAGNOSIS — Z363 Encounter for antenatal screening for malformations: Secondary | ICD-10-CM | POA: Insufficient documentation

## 2012-09-21 DIAGNOSIS — O358XX0 Maternal care for other (suspected) fetal abnormality and damage, not applicable or unspecified: Secondary | ICD-10-CM | POA: Insufficient documentation

## 2012-09-21 DIAGNOSIS — Z3490 Encounter for supervision of normal pregnancy, unspecified, unspecified trimester: Secondary | ICD-10-CM

## 2012-09-21 DIAGNOSIS — Z1389 Encounter for screening for other disorder: Secondary | ICD-10-CM | POA: Insufficient documentation

## 2012-09-23 ENCOUNTER — Encounter: Payer: Managed Care, Other (non HMO) | Admitting: Obstetrics & Gynecology

## 2012-10-14 ENCOUNTER — Ambulatory Visit (INDEPENDENT_AMBULATORY_CARE_PROVIDER_SITE_OTHER): Payer: Managed Care, Other (non HMO) | Admitting: Obstetrics & Gynecology

## 2012-10-14 VITALS — BP 122/84 | Temp 97.1°F | Wt 134.0 lb

## 2012-10-14 DIAGNOSIS — N870 Mild cervical dysplasia: Secondary | ICD-10-CM

## 2012-10-14 DIAGNOSIS — Z3480 Encounter for supervision of other normal pregnancy, unspecified trimester: Secondary | ICD-10-CM

## 2012-10-14 NOTE — Progress Notes (Signed)
p-74  RT lower back discomfort  "dull"

## 2012-10-14 NOTE — Progress Notes (Signed)
Complaining of adbomen getting hard up to 2x a day.  Cervix is closed and long with tone.  No evidence of PTL.

## 2012-10-14 NOTE — Patient Instructions (Addendum)

## 2012-10-20 ENCOUNTER — Encounter: Payer: Self-pay | Admitting: Obstetrics & Gynecology

## 2012-10-20 ENCOUNTER — Ambulatory Visit (INDEPENDENT_AMBULATORY_CARE_PROVIDER_SITE_OTHER): Payer: Managed Care, Other (non HMO) | Admitting: Obstetrics & Gynecology

## 2012-10-20 VITALS — BP 127/74 | Temp 98.0°F | Wt 136.0 lb

## 2012-10-20 DIAGNOSIS — Z3490 Encounter for supervision of normal pregnancy, unspecified, unspecified trimester: Secondary | ICD-10-CM

## 2012-10-20 DIAGNOSIS — Z3480 Encounter for supervision of other normal pregnancy, unspecified trimester: Secondary | ICD-10-CM

## 2012-10-20 NOTE — Progress Notes (Signed)
p-88 

## 2012-10-20 NOTE — Progress Notes (Signed)
Work in visit for left groin pain for 2 days. PTL precautions reviewed. I have recommended IBU (not after 30 weeks). She has an appt soon for a glucola.

## 2012-10-22 ENCOUNTER — Encounter (HOSPITAL_COMMUNITY): Payer: Self-pay

## 2012-10-22 ENCOUNTER — Inpatient Hospital Stay (HOSPITAL_COMMUNITY)
Admission: AD | Admit: 2012-10-22 | Discharge: 2012-10-22 | Disposition: A | Discharge status: Home / Self Care | Payer: Managed Care, Other (non HMO) | Source: Ambulatory Visit | Attending: Obstetrics and Gynecology | Admitting: Obstetrics and Gynecology

## 2012-10-22 DIAGNOSIS — Z3490 Encounter for supervision of normal pregnancy, unspecified, unspecified trimester: Secondary | ICD-10-CM

## 2012-10-22 DIAGNOSIS — N949 Unspecified condition associated with female genital organs and menstrual cycle: Secondary | ICD-10-CM | POA: Insufficient documentation

## 2012-10-22 DIAGNOSIS — O479 False labor, unspecified: Secondary | ICD-10-CM

## 2012-10-22 DIAGNOSIS — O26899 Other specified pregnancy related conditions, unspecified trimester: Secondary | ICD-10-CM

## 2012-10-22 DIAGNOSIS — O99891 Other specified diseases and conditions complicating pregnancy: Secondary | ICD-10-CM | POA: Insufficient documentation

## 2012-10-22 DIAGNOSIS — R109 Unspecified abdominal pain: Secondary | ICD-10-CM | POA: Insufficient documentation

## 2012-10-22 DIAGNOSIS — O99019 Anemia complicating pregnancy, unspecified trimester: Secondary | ICD-10-CM

## 2012-10-22 HISTORY — DX: Anemia, unspecified: D64.9

## 2012-10-22 LAB — URINALYSIS, ROUTINE W REFLEX MICROSCOPIC
Bilirubin Urine: NEGATIVE
Glucose, UA: NEGATIVE mg/dL
Hgb urine dipstick: NEGATIVE
Ketones, ur: NEGATIVE mg/dL
Protein, ur: NEGATIVE mg/dL
Urobilinogen, UA: 0.2 mg/dL (ref 0.0–1.0)

## 2012-10-22 LAB — WET PREP, GENITAL: Yeast Wet Prep HPF POC: NONE SEEN

## 2012-10-22 NOTE — MAU Note (Signed)
Pt denies burning or urgency with voiding. Did have ? Round ligament pain last week however none at present. Noted small gush of fluid around 1430, noted when abdomen tightened.

## 2012-10-22 NOTE — MAU Note (Signed)
Felt abd get tight 2 times, one after the other, then had some clear fluid gush out.  Has continued to note leaking and tightening.

## 2012-10-22 NOTE — MAU Provider Note (Signed)
Chief Complaint:  Vaginal Discharge and Abdominal Cramping   First Provider Initiated Contact with Patient 10/22/12 1718      HPI: Meagan Campos is a 26 y.o. G3P1011 at [redacted]w[redacted]d who presents to maternity admissions reporting a gush of fluid and leaking water discharge. She reports a gush of fluid earlier today that occurred with a contraction. She is reporting occasional contractions throughout the past few weeks. Denies vaginal bleeding. Good fetal movement. 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    Past Medical History: Past Medical History  Diagnosis Date  . Migraine   . Abnormal Pap smear   . Supervision of normal pregnancy 07/01/2012    [name] Genetic Screen First Screen ordered (NT normal, first screen normal) MSAFP normal  Anatomic [place]   Glucose Screen   GBS   Feeding Preference Breast Feeding  Contraception   Circumcision       . Anemia     Past obstetric history: OB History    Grav Para Term Preterm Abortions TAB SAB Ect Mult Living   3 1 1  1     1      # Outc Date GA Lbr Len/2nd Wgt Sex Del Anes PTL Lv   1 TRM  [redacted]w[redacted]d  7lb5oz(3.317kg) F SVD EPI  Yes   2 ABT            3 CUR               Past Surgical History: Past Surgical History  Procedure Date  . Tonsillectomy age 41    Family History: Family History  Problem Relation Age of Onset  . Cancer Maternal Grandmother     ovarian  . Cancer Paternal Grandmother     lung  . Cancer Paternal Grandfather     colon, prostate    Social History: History  Substance Use Topics  . Smoking status: Former Smoker    Quit date: 11/10/2009  . Smokeless tobacco: Never Used  . Alcohol Use: No     Comment: rare     Allergies: No Known Allergies  Meds:  Prescriptions prior to admission  Medication Sig Dispense Refill  . acetaminophen (TYLENOL) 500 MG tablet Take 1,000 mg by mouth daily as needed. For headache pain      . Prenatal Vit-Fe Fumarate-FA (PRENATAL MULTIVITAMIN)  TABS Take 1 tablet by mouth daily.        ROS: Pertinent findings in history of present illness.  Physical Exam  Blood pressure 118/65, pulse 82, temperature 98.2 F (36.8 C), temperature source Oral, resp. rate 18, height 5\' 4"  (1.626 m), weight 137 lb (62.143 kg), last menstrual period 05/07/2012. GENERAL: Well-developed, well-nourished female in no acute distress.  HEENT: normocephalic HEART: normal rate RESP: normal effort ABDOMEN: Soft, non-tender, gravid appropriate for gestational age EXTREMITIES: Nontender, no edema NEURO: alert and oriented SPECULUM EXAM: Physiologic discharge, no blood, cervix clean, sample for fern slide and wet prep obtained, no pooling noted BIMAUAL EXAM: Cervix closed, uterus gravid, no suprapubic tenderness    FHT:  Baseline 140 , moderate variability, few accelerations present, no decelerations Contractions: none   Labs: Results for orders placed during the hospital encounter of 10/22/12 (from the past 24 hour(s))  WET PREP, GENITAL     Status: Abnormal   Collection Time   10/22/12  4:10 PM      Component Value Range   Yeast Wet Prep HPF POC NONE SEEN  NONE SEEN   Trich, Wet  Prep NONE SEEN  NONE SEEN   Clue Cells Wet Prep HPF POC NONE SEEN  NONE SEEN   WBC, Wet Prep HPF POC MODERATE (*) NONE SEEN  URINALYSIS, ROUTINE W REFLEX MICROSCOPIC     Status: Abnormal   Collection Time   10/22/12  5:45 PM      Component Value Range   Color, Urine YELLOW  YELLOW   APPearance HAZY (*) CLEAR   Specific Gravity, Urine 1.015  1.005 - 1.030   pH 6.0  5.0 - 8.0   Glucose, UA NEGATIVE  NEGATIVE mg/dL   Hgb urine dipstick NEGATIVE  NEGATIVE   Bilirubin Urine NEGATIVE  NEGATIVE   Ketones, ur NEGATIVE  NEGATIVE mg/dL   Protein, ur NEGATIVE  NEGATIVE mg/dL   Urobilinogen, UA 0.2  0.0 - 1.0 mg/dL   Nitrite NEGATIVE  NEGATIVE   Leukocytes, UA NEGATIVE  NEGATIVE    Imaging:  No imaging needed  MAU Course: Patient monitored >1 hour - strip reassuring Fern  negative slide in MAU  Assessment: Normal exam, pregnant [redacted] weeks  Plan: Discharge home Preterm labor precautions discussed Patient will keep next scheduled follow-up with the [facility] in [place] Patient encouraged to return to MAU if she experiences vaginal bleeding, regular contractions or decrease in FM      Follow-up Information    Follow up with [facility] [employer] [facility] [place]. (as previously scheduled)    Contact information:   [address] [place] [address] [phone]      Follow up with [facility] [place] [facility]. (As needed if symptoms worsen)    Contact information:   [address] [place] [address] [phone]          Medication List     As of 10/22/2012  6:24 PM    TAKE these medications         acetaminophen 500 MG tablet   Commonly known as: TYLENOL   Take 1,000 mg by mouth daily as needed. For headache pain      prenatal multivitamin Tabs   Take 1 tablet by mouth daily.          Freddi Starr, PA-C 10/22/2012 6:24 PM

## 2012-10-26 NOTE — MAU Provider Note (Signed)
Attestation of Attending Supervision of Advanced Practitioner (CNM/NP): Evaluation and management procedures were performed by the Advanced Practitioner under my supervision and collaboration.  I have reviewed the Advanced Practitioner's note and chart, and I agree with the management and plan.  Rodolfo Gaster 10/26/2012 5:42 PM

## 2012-11-10 NOTE — L&D Delivery Note (Signed)
Attestation of Attending Supervision of Advanced Practitioner (CNM/NP): Evaluation and management procedures were performed by the Advanced Practitioner under my supervision and collaboration.  I have reviewed the Advanced Practitioner's note and chart, and I agree with the management and plan.  HARRAWAY-SMITH, Karey Stucki 9:43 AM     

## 2012-11-10 NOTE — L&D Delivery Note (Signed)
Delivery Note Pt pushed with a few contractions and at 6:18 AM a viable female was delivered via Vaginal, Spontaneous Delivery (Presentation: ROP).  APGAR: 9,9 ; weight: pending. Infant placed on pt's abd and dried. Cord clamped and cut by CNM.   Placenta status: spont, intact.  Cord: 3 vessel   Anesthesia: Epidural  Episiotomy: none Lacerations: none Est. Blood Loss (mL):200cc   Mom to postpartum.  Baby to nursery-stable.  Cam Hai 01/22/2013, 6:41 AM

## 2012-11-16 ENCOUNTER — Ambulatory Visit (INDEPENDENT_AMBULATORY_CARE_PROVIDER_SITE_OTHER): Payer: Managed Care, Other (non HMO) | Admitting: Obstetrics & Gynecology

## 2012-11-16 VITALS — BP 142/66 | Temp 96.8°F | Wt 145.0 lb

## 2012-11-16 DIAGNOSIS — R51 Headache: Secondary | ICD-10-CM

## 2012-11-16 DIAGNOSIS — O9989 Other specified diseases and conditions complicating pregnancy, childbirth and the puerperium: Secondary | ICD-10-CM

## 2012-11-16 DIAGNOSIS — O26899 Other specified pregnancy related conditions, unspecified trimester: Secondary | ICD-10-CM

## 2012-11-16 DIAGNOSIS — Z3480 Encounter for supervision of other normal pregnancy, unspecified trimester: Secondary | ICD-10-CM

## 2012-11-16 DIAGNOSIS — Z3490 Encounter for supervision of normal pregnancy, unspecified, unspecified trimester: Secondary | ICD-10-CM

## 2012-11-16 LAB — CBC
HCT: 35 % — ABNORMAL LOW (ref 36.0–46.0)
Hemoglobin: 11.9 g/dL — ABNORMAL LOW (ref 12.0–15.0)
MCV: 86 fL (ref 78.0–100.0)
RBC: 4.07 MIL/uL (ref 3.87–5.11)
RDW: 13.2 % (ref 11.5–15.5)
WBC: 9.5 10*3/uL (ref 4.0–10.5)

## 2012-11-16 MED ORDER — BUTALBITAL-APAP-CAFFEINE 50-500-40 MG PO TABS: 1.0000 | ORAL_TABLET | ORAL | Status: DC | PRN

## 2012-11-16 MED ORDER — RHO D IMMUNE GLOBULIN 1500 UNIT/2ML IJ SOLN
300.0000 ug | Freq: Once | INTRAMUSCULAR | Status: AC
  Administered 2012-11-16: 300 ug via INTRAMUSCULAR

## 2012-11-16 NOTE — Progress Notes (Signed)
p-99  28 week labs today

## 2012-11-16 NOTE — Patient Instructions (Signed)
Return to clinic for any obstetric concerns or go to MAU for evaluation  

## 2012-11-16 NOTE — Progress Notes (Signed)
SBP 142, recheck 126/72.  Had GHTN last pregnancy.  Will continue to monitor, no proteinuria today, CMET and CBC will be checked. Also reports tension headache/migraine above eyes and behin eyes that has been off and on for past two weeks. She has been diagnosed with migraines in the past, was on Imitrex for a while.  Headaches not helped with Tylenol.  Fioricet given, if continues she will need further evaluation/referral to Jannifer Rodney, NP.  1 hr GTT and other third trimester labs also to be checked today, will also get Rhogam today. No other complaints or concerns.  Fetal movement and labor precautions reviewed.

## 2012-11-17 ENCOUNTER — Other Ambulatory Visit: Payer: Managed Care, Other (non HMO)

## 2012-11-17 ENCOUNTER — Encounter: Payer: Self-pay | Admitting: Obstetrics & Gynecology

## 2012-11-17 DIAGNOSIS — O99810 Abnormal glucose complicating pregnancy: Secondary | ICD-10-CM | POA: Insufficient documentation

## 2012-11-17 LAB — COMPREHENSIVE METABOLIC PANEL
ALT: 8 U/L (ref 0–35)
AST: 12 U/L (ref 0–37)
Albumin: 3.6 g/dL (ref 3.5–5.2)
Alkaline Phosphatase: 51 U/L (ref 39–117)
BUN: 11 mg/dL (ref 6–23)
Chloride: 105 mEq/L (ref 96–112)
Potassium: 4.3 mEq/L (ref 3.5–5.3)

## 2012-11-17 LAB — GLUCOSE TOLERANCE, 1 HOUR (50G) W/O FASTING: Glucose, 1 Hour GTT: 163 mg/dL — ABNORMAL HIGH (ref 70–140)

## 2012-11-17 LAB — HIV ANTIBODY (ROUTINE TESTING W REFLEX): HIV: NONREACTIVE

## 2012-11-17 LAB — ANTIBODY SCREEN: Antibody Screen: NEGATIVE

## 2012-11-18 ENCOUNTER — Other Ambulatory Visit (INDEPENDENT_AMBULATORY_CARE_PROVIDER_SITE_OTHER): Payer: Managed Care, Other (non HMO)

## 2012-11-18 DIAGNOSIS — R7309 Other abnormal glucose: Secondary | ICD-10-CM

## 2012-11-18 NOTE — Progress Notes (Signed)
Pt here for 3 hr Fasting GTT.

## 2012-11-19 LAB — GLUCOSE TOLERANCE, 3 HOURS
Glucose Tolerance, 2 hour: 94 mg/dL (ref 70–164)
Glucose, GTT - 3 Hour: 106 mg/dL (ref 70–144)

## 2012-11-22 ENCOUNTER — Encounter: Payer: Self-pay | Admitting: Obstetrics & Gynecology

## 2012-11-30 ENCOUNTER — Ambulatory Visit (INDEPENDENT_AMBULATORY_CARE_PROVIDER_SITE_OTHER): Payer: Managed Care, Other (non HMO) | Admitting: Obstetrics & Gynecology

## 2012-11-30 VITALS — BP 129/68 | Wt 147.0 lb

## 2012-11-30 DIAGNOSIS — O479 False labor, unspecified: Secondary | ICD-10-CM

## 2012-11-30 DIAGNOSIS — Z3480 Encounter for supervision of other normal pregnancy, unspecified trimester: Secondary | ICD-10-CM

## 2012-11-30 NOTE — Patient Instructions (Addendum)
  Fetal Fibronectin This is a test done to help evaluate a pregnant woman's risk of pre-term delivery. It is generally done when you are 26 to [redacted] weeks pregnant and are having symptoms of premature labor. A Dacron swab is used to take a sample of cervical or vaginal fluid from the back portion of the vagina or from the area just outside the opening of the cervix. Fetal fibronectin (fFN) is a glycoprotein that can be used to help predict the short term risk of premature delivery. fFN is produced at the boundary between the amniotic sac and the lining of the mother's uterus. This is called the unteroplacental junction. Fetal fibronectin is largely confined to this junction and thought to help maintain the integrity of the boundary. fFN is normally detectable in cervicovaginal fluid during the first 20 to 24 weeks of pregnancy, and then is detectable again after about 36 weeks.  Finding fFN in cervicovaginal fluids after 36 weeks is not unusual as it is often released by the body as it gets ready for childbirth. The elevated fFN found in vaginal fluids early in pregnancy may simply reflect the normal growth and establishment of tissues at the unteroplacental junction with levels falling when this phase is complete. What is known is that fFN that is detected between 24 and 36 weeks of pregnancy is not normal. Elevated levels reflect a disturbance at the uteroplacental junction and have been associated with an increased risk of pre-term labor and delivery. Knowing whether or not a woman is likely to deliver prematurely helps your caregiver plan a course of action. The fFN test is a relatively non-invasive tool to help the caregiver to distinguish between those who are likely to deliver shortly and those who are not.  PREPARATION FOR TEST   Inform the person conducting the test if you have a medical condition or are using any medications that cause excessive bleeding.  Do not have sexual intercourse for 24 hours  before the procedure. NORMAL FINDINGS  Pregnancy = 50 nanograms/ml Ranges for normal findings may vary among different laboratories and hospitals. You should always check with your doctor after having lab work or other tests done to discuss the meaning of your test results and whether your values are considered within normal limits. MEANING OF TEST  Your caregiver will go over the test results with you and discuss the importance and meaning of your results, as well as treatment options and the need for additional tests if necessary. OBTAINING THE TEST RESULTS  It is your responsibility to obtain your test results. Ask the lab or department performing the test when and how you will get your results. Document Released: 08/28/2004 Document Revised: 01/19/2012 Document Reviewed: 10/06/2008 [facility] Patient Information 2013 [place], [place].

## 2012-11-30 NOTE — Progress Notes (Signed)
p-84 

## 2012-11-30 NOTE — Progress Notes (Signed)
BP nml this visit.  nml weight gain.  Pt c/o contractions and pressure in the vagina.  Cervix is 1 cm with tone at top of cervix.  FFN obtained.  NST reactive with no contractions

## 2012-12-01 ENCOUNTER — Telehealth: Payer: Self-pay | Admitting: *Deleted

## 2012-12-01 ENCOUNTER — Encounter: Payer: Self-pay | Admitting: *Deleted

## 2012-12-01 ENCOUNTER — Ambulatory Visit (INDEPENDENT_AMBULATORY_CARE_PROVIDER_SITE_OTHER): Payer: Managed Care, Other (non HMO) | Admitting: Obstetrics & Gynecology

## 2012-12-01 VITALS — BP 122/71 | Wt 145.0 lb

## 2012-12-01 DIAGNOSIS — O4700 False labor before 37 completed weeks of gestation, unspecified trimester: Secondary | ICD-10-CM

## 2012-12-01 DIAGNOSIS — Z3480 Encounter for supervision of other normal pregnancy, unspecified trimester: Secondary | ICD-10-CM

## 2012-12-01 NOTE — Progress Notes (Signed)
p-86  Lots of vaginal pressure and severe headache during night

## 2012-12-01 NOTE — Telephone Encounter (Signed)
Pt notified of negative Fetal fibronectin.

## 2012-12-01 NOTE — Patient Instructions (Signed)

## 2012-12-01 NOTE — Progress Notes (Signed)
Cervix 1 and long.  FFN did not return.  PTL precautions given.  Will write pt out of work for 1 week and re-evaluate.  Will call FFn results.

## 2012-12-08 ENCOUNTER — Ambulatory Visit (INDEPENDENT_AMBULATORY_CARE_PROVIDER_SITE_OTHER): Payer: Managed Care, Other (non HMO) | Admitting: Obstetrics & Gynecology

## 2012-12-08 ENCOUNTER — Encounter: Payer: Self-pay | Admitting: *Deleted

## 2012-12-08 VITALS — BP 140/80 | Wt 150.0 lb

## 2012-12-08 DIAGNOSIS — O139 Gestational [pregnancy-induced] hypertension without significant proteinuria, unspecified trimester: Secondary | ICD-10-CM | POA: Insufficient documentation

## 2012-12-08 DIAGNOSIS — Z3480 Encounter for supervision of other normal pregnancy, unspecified trimester: Secondary | ICD-10-CM

## 2012-12-08 DIAGNOSIS — B373 Candidiasis of vulva and vagina: Secondary | ICD-10-CM

## 2012-12-08 LAB — CBC
Hemoglobin: 11.6 g/dL — ABNORMAL LOW (ref 12.0–15.0)
MCV: 83.8 fL (ref 78.0–100.0)
Platelets: 256 10*3/uL (ref 150–400)
RBC: 4.19 MIL/uL (ref 3.87–5.11)
WBC: 7.5 10*3/uL (ref 4.0–10.5)

## 2012-12-08 LAB — COMPREHENSIVE METABOLIC PANEL
ALT: 9 U/L (ref 0–35)
CO2: 25 mEq/L (ref 19–32)
Calcium: 9.3 mg/dL (ref 8.4–10.5)
Chloride: 103 mEq/L (ref 96–112)
Glucose, Bld: 79 mg/dL (ref 70–99)
Sodium: 137 mEq/L (ref 135–145)
Total Protein: 6.5 g/dL (ref 6.0–8.3)

## 2012-12-08 LAB — URIC ACID: Uric Acid, Serum: 2.6 mg/dL (ref 2.4–7.0)

## 2012-12-08 NOTE — Progress Notes (Signed)
p-88 

## 2012-12-08 NOTE — Progress Notes (Signed)
P-92 - Pt states decreased fetal movement

## 2012-12-08 NOTE — Progress Notes (Signed)
Pt has elevated pressure x2 now.  There is 1+ proteinuria.  Will get PIH labs, prot/creat ration, [place] for growth, fluid, BPP.  If elevated again or any lab abnormality,will give steroids and start 2x week testing.  Pt c/o vaginal discharge.  BD Affirm done.  Cervix is now closed, doing better out of work.  Will maintain pt out of work for duration of pregnancy.

## 2012-12-09 ENCOUNTER — Ambulatory Visit (HOSPITAL_COMMUNITY)
Admission: RE | Admit: 2012-12-09 | Discharge: 2012-12-09 | Disposition: A | Discharge status: Home / Self Care | Payer: Managed Care, Other (non HMO) | Source: Ambulatory Visit | Attending: Obstetrics & Gynecology | Admitting: Obstetrics & Gynecology

## 2012-12-09 ENCOUNTER — Other Ambulatory Visit: Payer: Self-pay | Admitting: Obstetrics & Gynecology

## 2012-12-09 DIAGNOSIS — O139 Gestational [pregnancy-induced] hypertension without significant proteinuria, unspecified trimester: Secondary | ICD-10-CM | POA: Insufficient documentation

## 2012-12-09 DIAGNOSIS — Z3689 Encounter for other specified antenatal screening: Secondary | ICD-10-CM | POA: Insufficient documentation

## 2012-12-09 LAB — WET PREP BY MOLECULAR PROBE
Candida species: NEGATIVE
Gardnerella vaginalis: NEGATIVE
Trichomonas vaginosis: NEGATIVE

## 2012-12-09 LAB — PROTEIN / CREATININE RATIO, URINE: Creatinine, Urine: 150.2 mg/dL

## 2012-12-14 ENCOUNTER — Ambulatory Visit (INDEPENDENT_AMBULATORY_CARE_PROVIDER_SITE_OTHER): Payer: Managed Care, Other (non HMO) | Admitting: Obstetrics & Gynecology

## 2012-12-14 VITALS — BP 129/81 | Wt 152.0 lb

## 2012-12-14 DIAGNOSIS — Z3480 Encounter for supervision of other normal pregnancy, unspecified trimester: Secondary | ICD-10-CM

## 2012-12-14 DIAGNOSIS — Z3490 Encounter for supervision of normal pregnancy, unspecified, unspecified trimester: Secondary | ICD-10-CM

## 2012-12-14 NOTE — Progress Notes (Signed)
P - 105 

## 2012-12-14 NOTE — Progress Notes (Signed)
Protein creatinine ratio 0.7, labs nml, fetus well grown with nml fluid.  BPP did not show sustained breathing, but breathing was present.  BP nml today.  Will continue to monitor BP and Urine weekly.  If pt has another elevated BP will start 2 week testing.  No complaints today of headache, scotomata, RUQ pain.

## 2012-12-21 ENCOUNTER — Ambulatory Visit: Payer: Managed Care, Other (non HMO) | Admitting: *Deleted

## 2012-12-21 ENCOUNTER — Ambulatory Visit (INDEPENDENT_AMBULATORY_CARE_PROVIDER_SITE_OTHER): Payer: Managed Care, Other (non HMO) | Admitting: *Deleted

## 2012-12-21 VITALS — BP 126/88 | Wt 153.0 lb

## 2012-12-21 DIAGNOSIS — O133 Gestational [pregnancy-induced] hypertension without significant proteinuria, third trimester: Secondary | ICD-10-CM

## 2012-12-21 DIAGNOSIS — O139 Gestational [pregnancy-induced] hypertension without significant proteinuria, unspecified trimester: Secondary | ICD-10-CM

## 2012-12-21 DIAGNOSIS — Z3480 Encounter for supervision of other normal pregnancy, unspecified trimester: Secondary | ICD-10-CM

## 2012-12-21 NOTE — Progress Notes (Signed)
p-87 

## 2012-12-28 ENCOUNTER — Ambulatory Visit (INDEPENDENT_AMBULATORY_CARE_PROVIDER_SITE_OTHER): Payer: Managed Care, Other (non HMO) | Admitting: Obstetrics & Gynecology

## 2012-12-28 VITALS — BP 119/64 | Wt 156.0 lb

## 2012-12-28 DIAGNOSIS — Z3490 Encounter for supervision of normal pregnancy, unspecified, unspecified trimester: Secondary | ICD-10-CM

## 2012-12-28 DIAGNOSIS — Z3480 Encounter for supervision of other normal pregnancy, unspecified trimester: Secondary | ICD-10-CM

## 2012-12-28 NOTE — Progress Notes (Signed)
Only happens at night, relieved by focusing on something else or having intercourse.  Likely anxiety attacks, doe snot want to try medication. Recommended relaxation techniques, massages etc to see if it helps. She was advisde to come in to the ED for evaluation for any worsening or concerning symptoms.  Normal BP today.  No other complaints or concerns.  Fetal movement and labor precautions reviewed.

## 2012-12-28 NOTE — Progress Notes (Signed)
P = 87  Pt feels her heart is "racing and twitching" at night.

## 2012-12-28 NOTE — Patient Instructions (Signed)
Return to clinic for any obstetric concerns or go to MAU for evaluation  

## 2013-01-11 ENCOUNTER — Ambulatory Visit (INDEPENDENT_AMBULATORY_CARE_PROVIDER_SITE_OTHER): Payer: Managed Care, Other (non HMO) | Admitting: Obstetrics & Gynecology

## 2013-01-11 VITALS — BP 145/76 | Wt 156.0 lb

## 2013-01-11 DIAGNOSIS — Z139 Encounter for screening, unspecified: Secondary | ICD-10-CM

## 2013-01-11 DIAGNOSIS — O133 Gestational [pregnancy-induced] hypertension without significant proteinuria, third trimester: Secondary | ICD-10-CM

## 2013-01-11 LAB — CBC
Hemoglobin: 10.9 g/dL — ABNORMAL LOW (ref 12.0–15.0)
MCH: 26.6 pg (ref 26.0–34.0)
MCV: 79.5 fL (ref 78.0–100.0)
RBC: 4.1 MIL/uL (ref 3.87–5.11)

## 2013-01-11 NOTE — Progress Notes (Signed)
Pt's BP elevated and consistent with gestational hypertension (elevated on 3rd occasion now).  PIH labs, protein/creatinine ration, 24 hour urine, [place] growth, 2x week testing.  Trace proteinuria today.  Cultures next visit.

## 2013-01-11 NOTE — Progress Notes (Signed)
P - 98-  Pt states decreased fetal movement and a lot of pressure - she would like to be checked today

## 2013-01-11 NOTE — Progress Notes (Signed)
p-98 

## 2013-01-12 LAB — COMPREHENSIVE METABOLIC PANEL
CO2: 26 mEq/L (ref 19–32)
Calcium: 9 mg/dL (ref 8.4–10.5)
Creat: 0.51 mg/dL (ref 0.50–1.10)
Glucose, Bld: 98 mg/dL (ref 70–99)
Total Bilirubin: 0.3 mg/dL (ref 0.3–1.2)
Total Protein: 6.1 g/dL (ref 6.0–8.3)

## 2013-01-13 LAB — PROTEIN, URINE, 24 HOUR: Protein, Urine: 11 mg/dL

## 2013-01-14 ENCOUNTER — Ambulatory Visit (HOSPITAL_COMMUNITY)
Admission: RE | Admit: 2013-01-14 | Discharge: 2013-01-14 | Disposition: A | Discharge status: Home / Self Care | Payer: Managed Care, Other (non HMO) | Source: Ambulatory Visit | Attending: Obstetrics & Gynecology | Admitting: Obstetrics & Gynecology

## 2013-01-14 DIAGNOSIS — O139 Gestational [pregnancy-induced] hypertension without significant proteinuria, unspecified trimester: Secondary | ICD-10-CM | POA: Insufficient documentation

## 2013-01-17 ENCOUNTER — Telehealth: Payer: Self-pay | Admitting: *Deleted

## 2013-01-17 NOTE — Telephone Encounter (Signed)
I called pt to let her know induction has been scheduled and we need to do GBS as soon as possible per Dr. Penne Lash. Her appt is tomorrow to come in for routine OB and NST and we will do GBS at that time.

## 2013-01-17 NOTE — Addendum Note (Signed)
Addended by: Lesly Dukes on: 01/17/2013 04:11 PM   Modules accepted: Orders

## 2013-01-17 NOTE — Progress Notes (Addendum)
Growth at 18%.  MFM recommends delivery at 37 weeks due to gestational hypertension.  Continue 2x week testing until delivery.  Cervix 1.5/50/-2.  Will do cytotec.

## 2013-01-18 ENCOUNTER — Ambulatory Visit (INDEPENDENT_AMBULATORY_CARE_PROVIDER_SITE_OTHER): Payer: Managed Care, Other (non HMO) | Admitting: Obstetrics & Gynecology

## 2013-01-18 VITALS — BP 116/73 | Wt 158.0 lb

## 2013-01-18 DIAGNOSIS — Z113 Encounter for screening for infections with a predominantly sexual mode of transmission: Secondary | ICD-10-CM

## 2013-01-18 LAB — OB RESULTS CONSOLE GC/CHLAMYDIA: Chlamydia: NEGATIVE

## 2013-01-18 NOTE — Progress Notes (Signed)
Pt has induction scheduled for Friday, march 14th at 7:30 p.m.  NST is reactive with baseline of 140.  Moderate variability.  GBS today.

## 2013-01-18 NOTE — Progress Notes (Signed)
Here today for routine prenatal, nst and cultures.

## 2013-01-18 NOTE — Patient Instructions (Signed)
Labor Induction  Most women go into labor on their own between 37 and 42 weeks of the pregnancy. When this does not happen or when there is a medical need, medicine or other methods may be used to induce labor. Labor induction causes a pregnant woman's uterus to contract. It also causes the cervix to soften (ripen), open (dilate), and thin out (efface). Usually, labor is not induced before 39 weeks of the pregnancy unless there is a problem with the baby or mother. Whether your labor will be induced depends on a number of factors, including the following:  The medical condition of you and the baby.  How many weeks along you are.  The status of baby's lung maturity.  The condition of the cervix.  The position of the baby. REASONS FOR LABOR INDUCTION  The health of the baby or mother is at risk.  The pregnancy is overdue by 1 week or more.  The water breaks but labor does not start on its own.  The mother has a health condition or serious illness such as high blood pressure, infection, placental abruption, or diabetes.  The amniotic fluid amounts are low around the baby.  The baby is distressed. REASONS TO NOT INDUCE LABOR Labor induction may not be a good idea if:  It is shown that your baby does not tolerate labor.  An induction is just more convenient.  You want the baby to be born on a certain date, like a holiday.  You have had previous surgeries on your uterus, such as a myomectomy or the removal of fibroids.  Your placenta lies very low in the uterus and blocks the opening of the cervix (placenta previa).  Your baby is not in a head down position.  The umbilical cord drops down into the birth canal in front of the baby. This could cut off the baby's blood and oxygen supply.  You have had a previous cesarean delivery.  There areunusual circumstances, such as the baby being extremely premature. RISKS AND COMPLICATIONS Problems may occur in the process of induction  and plans may need to be modified as a situation unfolds. Some of the risks of induction include:  Change in fetal heart rate, such as too high, too low, or erratic.  Risk of fetal distress.  Risk of infection to mother and baby.  Increased chance of having a cesarean delivery.  The rare, but increased chance that the placenta will separate from the uterus (abruption).  Uterine rupture (very rare). When induction is needed for medical reasons, the benefits of induction may outweigh the risks. BEFORE THE PROCEDURE Your caregiver will check your cervix and the baby's position. This will help your caregiver decide if you are far enough along for an induction to work. PROCEDURE Several methods of labor induction may be used, such as:   Taking prostaglandin medicine to dilate and ripen the cervix. The medicine will also start contractions. It can be taken by mouth or by inserting a suppository into the vagina.  A thin tube (catheter) with a balloon on the end may be inserted into your vagina to dilate the cervix. Once inserted, the balloon expands with water, which causes the cervix to open.  Striping the membranes. Your caregiver inserts a finger between the cervix and membranes, which causes the cervix to be stretched and may cause the uterus to contract. This is often done during an office visit. You will be sent home to wait for the contractions to begin. You will   then come in for an induction.  Breaking the water. Your caregiver will make a hole in the amniotic sac using a small instrument. Once the amniotic sac breaks, contractions should begin. This may still take hours to see an effect.  Taking medicine to trigger or strengthen contractions. This medicine is given intravenously through a tube in your arm. All of the methods of induction, besides stripping the membranes, will be done in the hospital. Induction is done in the hospital so that you and the baby can be carefully  monitored. AFTER THE PROCEDURE Some inductions can take up to 2 or 3 days. Depending on the cervix, it usually takes less time. It takes longer when you are induced early in the pregnancy or if this is your first pregnancy. If a mother is still pregnant and the induction has been going on for 2 to 3 days, either the mother will be sent home or a cesarean delivery will be needed. Document Released: 03/18/2007 Document Revised: 01/19/2012 Document Reviewed: 09/01/2011 [employer] Patient Information 2013 [employer].  

## 2013-01-19 LAB — GC/CHLAMYDIA PROBE AMP, URINE
Chlamydia, Swab/Urine, PCR: NEGATIVE
GC Probe Amp, Urine: NEGATIVE

## 2013-01-20 ENCOUNTER — Other Ambulatory Visit: Payer: Self-pay | Admitting: Obstetrics & Gynecology

## 2013-01-20 DIAGNOSIS — O169 Unspecified maternal hypertension, unspecified trimester: Secondary | ICD-10-CM

## 2013-01-21 ENCOUNTER — Ambulatory Visit (HOSPITAL_COMMUNITY): Payer: Managed Care, Other (non HMO)

## 2013-01-21 ENCOUNTER — Inpatient Hospital Stay (HOSPITAL_COMMUNITY)
Admission: RE | Admit: 2013-01-21 | Discharge: 2013-01-23 | DRG: 775 | Disposition: A | Discharge status: Home / Self Care | Payer: Managed Care, Other (non HMO) | Source: Ambulatory Visit | Attending: Obstetrics & Gynecology | Admitting: Obstetrics & Gynecology

## 2013-01-21 ENCOUNTER — Encounter: Payer: Self-pay | Admitting: Obstetrics & Gynecology

## 2013-01-21 ENCOUNTER — Encounter (HOSPITAL_COMMUNITY): Payer: Self-pay

## 2013-01-21 VITALS — BP 103/63 | HR 77 | Temp 97.8°F | Resp 18 | Ht 64.0 in | Wt 158.0 lb

## 2013-01-21 DIAGNOSIS — O99810 Abnormal glucose complicating pregnancy: Secondary | ICD-10-CM

## 2013-01-21 DIAGNOSIS — O99893 Other specified diseases and conditions complicating puerperium: Secondary | ICD-10-CM | POA: Diagnosis not present

## 2013-01-21 DIAGNOSIS — R209 Unspecified disturbances of skin sensation: Secondary | ICD-10-CM | POA: Diagnosis not present

## 2013-01-21 DIAGNOSIS — O139 Gestational [pregnancy-induced] hypertension without significant proteinuria, unspecified trimester: Principal | ICD-10-CM | POA: Diagnosis present

## 2013-01-21 DIAGNOSIS — H02409 Unspecified ptosis of unspecified eyelid: Secondary | ICD-10-CM | POA: Diagnosis not present

## 2013-01-21 LAB — COMPREHENSIVE METABOLIC PANEL
Alkaline Phosphatase: 108 U/L (ref 39–117)
BUN: 11 mg/dL (ref 6–23)
Chloride: 99 mEq/L (ref 96–112)
GFR calc Af Amer: >90 mL/min (ref >90)
GFR calc non Af Amer: >90 mL/min (ref >90)
Glucose, Bld: 79 mg/dL (ref 70–99)
Potassium: 3.9 mEq/L (ref 3.5–5.1)
Total Bilirubin: 0.3 mg/dL (ref 0.3–1.2)
Total Protein: 6.5 g/dL (ref 6.0–8.3)

## 2013-01-21 LAB — CBC
HCT: 32.5 % — ABNORMAL LOW (ref 36.0–46.0)
MCV: 80.2 fL (ref 78.0–100.0)
RDW: 13.4 % (ref 11.5–15.5)
WBC: 8.2 10*3/uL (ref 4.0–10.5)

## 2013-01-21 LAB — PROTEIN / CREATININE RATIO, URINE: Creatinine, Urine: 124.38 mg/dL

## 2013-01-21 MED ORDER — OXYTOCIN 40 UNITS IN LACTATED RINGERS INFUSION - SIMPLE MED
62.5000 mL/h | INTRAVENOUS | Status: DC
  Filled 2013-01-21: qty 1000

## 2013-01-21 MED ORDER — NALBUPHINE SYRINGE 5 MG/0.5 ML: 5.0000 mg | INJECTION | INTRAMUSCULAR | Status: DC | PRN

## 2013-01-21 MED ORDER — ACETAMINOPHEN 325 MG PO TABS: 650.0000 mg | ORAL_TABLET | ORAL | Status: DC | PRN

## 2013-01-21 MED ORDER — OXYTOCIN BOLUS FROM INFUSION
500.0000 mL | INTRAVENOUS | Status: DC
  Administered 2013-01-22: 500 mL via INTRAVENOUS

## 2013-01-21 MED ORDER — MISOPROSTOL 25 MCG QUARTER TABLET
25.0000 ug | ORAL_TABLET | ORAL | Status: DC | PRN
  Administered 2013-01-21 – 2013-01-22 (×2): 25 ug via VAGINAL
  Filled 2013-01-21 (×2): qty 0.25

## 2013-01-21 MED ORDER — LACTATED RINGERS IV SOLN
INTRAVENOUS | Status: DC
  Administered 2013-01-21: 21:00:00 via INTRAVENOUS

## 2013-01-21 MED ORDER — IBUPROFEN 600 MG PO TABS
600.0000 mg | ORAL_TABLET | Freq: Four times a day (QID) | ORAL | Status: DC | PRN
  Administered 2013-01-22: 600 mg via ORAL
  Filled 2013-01-21: qty 1

## 2013-01-21 MED ORDER — CITRIC ACID-SODIUM CITRATE 334-500 MG/5ML PO SOLN: 30.0000 mL | ORAL | Status: DC | PRN

## 2013-01-21 MED ORDER — OXYCODONE-ACETAMINOPHEN 5-325 MG PO TABS: 1.0000 | ORAL_TABLET | ORAL | Status: DC | PRN

## 2013-01-21 MED ORDER — LACTATED RINGERS IV SOLN: 500.0000 mL | INTRAVENOUS | Status: DC | PRN

## 2013-01-21 MED ORDER — ONDANSETRON HCL 4 MG/2ML IJ SOLN: 4.0000 mg | Freq: Four times a day (QID) | INTRAMUSCULAR | Status: DC | PRN

## 2013-01-21 MED ORDER — ZOLPIDEM TARTRATE 5 MG PO TABS
5.0000 mg | ORAL_TABLET | Freq: Once | ORAL | Status: AC
  Administered 2013-01-21: 5 mg via ORAL
  Filled 2013-01-21: qty 1

## 2013-01-21 MED ORDER — TERBUTALINE SULFATE 1 MG/ML IJ SOLN: 0.2500 mg | Freq: Once | INTRAMUSCULAR | Status: AC | PRN

## 2013-01-21 MED ORDER — LIDOCAINE HCL (PF) 1 % IJ SOLN
30.0000 mL | INTRAMUSCULAR | Status: DC | PRN
  Filled 2013-01-21: qty 30

## 2013-01-21 NOTE — H&P (Signed)
Meagan Campos is a 27 y.o. female presenting for induction.  Maternal Medical History:  Reason for admission: Nausea. Pt presents at 37 weeks for induction of labor d/t PIH.   Contractions: Frequency: irregular.   Perceived severity is mild.    Fetal activity: Perceived fetal activity is normal.    Prenatal complications: PIH.     OB History   Grav Para Term Preterm Abortions TAB SAB Ect Mult Living   3 1 1  1     1      Past Medical History  Diagnosis Date  . Migraine   . Abnormal Pap smear   . Supervision of normal pregnancy 07/01/2012    [name] Genetic Screen First Screen ordered (NT normal, first screen normal) MSAFP normal  Anatomic [place]   Glucose Screen   GBS   Feeding Preference Breast Feeding  Contraception   Circumcision       . Anemia    Past Surgical History  Procedure Laterality Date  . Tonsillectomy  age 61   Family History: family history includes Cancer in her maternal grandmother, paternal grandfather, and paternal grandmother. Social History:  reports that she quit smoking about 3 years ago. She has never used smokeless tobacco. She reports that she does not drink alcohol or use illicit drugs.     Review of Systems  Constitutional: Negative.   Eyes: Negative for blurred vision.  Respiratory: Negative.   Cardiovascular: Negative.   Gastrointestinal: Negative for nausea, vomiting, abdominal pain, diarrhea and constipation.  Genitourinary: Negative for dysuria, urgency, frequency, hematuria and flank pain.       Negative for vaginal bleeding, LOF   Musculoskeletal: Negative.   Neurological: Negative.  Negative for headaches.  Psychiatric/Behavioral: Negative.     Dilation: 2 Effacement (%): 50 Station: -1 Exam by:: Telford Nab, CNM Blood pressure 141/54, pulse 87, temperature 97.5 F (36.4 C), temperature source Oral, resp. rate 20, height 5\' 4"  (1.626 m), weight 158 lb (71.668 kg), last menstrual period 05/07/2012. Maternal Exam:  Uterine  Assessment: Contraction strength is mild.  Contraction frequency is irregular.   Abdomen: Estimated fetal weight is 6 lbs (5#3oz by u/s on 3/7).   Fetal presentation: vertex  Introitus: Normal vulva. Normal vagina.  Pelvis: adequate for delivery.      Fetal Exam Fetal Monitor Review: Baseline rate: 120.  Variability: moderate (6-25 bpm).   Pattern: accelerations present and no decelerations.    Fetal State Assessment: Category I - tracings are normal.     Physical Exam  Nursing note and vitals reviewed. Constitutional: She is oriented to person, place, and time. She appears well-developed and well-nourished. No distress.  Cardiovascular: Normal rate.   Respiratory: Effort normal.  GI: Soft. There is no tenderness.  Musculoskeletal: Normal range of motion.  Neurological: She is alert and oriented to person, place, and time. She has normal reflexes.  Skin: Skin is warm and dry.  Psychiatric: She has a normal mood and affect.    Prenatal labs: ABO, Rh: A/NEG/-- (08/22 1314) Antibody: NEG (01/07 1615) Rubella: 34.2 (08/22 1314) RPR: NON REAC (01/07 1615)  HBsAg: NEGATIVE (08/22 1314)  HIV: NON REACTIVE (01/07 1615)  GBS: Negative (03/11 0000)  Assessment/Plan: 27 y.o. G3P1011 at [redacted]w[redacted]d  PIH - induction of labor, begin cervical ripening with cytotec Routine intrapartum care PIH labs ordered, BP mild range now   [facility] 01/21/2013, 9:17 PM

## 2013-01-21 NOTE — H&P (Signed)
Attestation of Attending Supervision of Advanced Practitioner (CNM/NP): Evaluation and management procedures were performed by the Advanced Practitioner under my supervision and collaboration.  I have reviewed the Advanced Practitioner's note and chart, and I agree with the management and plan.  HARRAWAY-SMITH, CAROLYN 9:47 PM     

## 2013-01-22 ENCOUNTER — Encounter (HOSPITAL_COMMUNITY): Payer: Self-pay

## 2013-01-22 ENCOUNTER — Inpatient Hospital Stay (HOSPITAL_COMMUNITY): Payer: Managed Care, Other (non HMO) | Admitting: Anesthesiology

## 2013-01-22 ENCOUNTER — Encounter (HOSPITAL_COMMUNITY): Payer: Self-pay | Admitting: Anesthesiology

## 2013-01-22 DIAGNOSIS — O139 Gestational [pregnancy-induced] hypertension without significant proteinuria, unspecified trimester: Secondary | ICD-10-CM

## 2013-01-22 DIAGNOSIS — R209 Unspecified disturbances of skin sensation: Secondary | ICD-10-CM

## 2013-01-22 DIAGNOSIS — O9989 Other specified diseases and conditions complicating pregnancy, childbirth and the puerperium: Secondary | ICD-10-CM

## 2013-01-22 DIAGNOSIS — H02409 Unspecified ptosis of unspecified eyelid: Secondary | ICD-10-CM

## 2013-01-22 LAB — CBC
HCT: 30.5 % — ABNORMAL LOW (ref 36.0–46.0)
Hemoglobin: 10 g/dL — ABNORMAL LOW (ref 12.0–15.0)
Platelets: 196 10*3/uL (ref 150–400)
RBC: 3.96 MIL/uL (ref 3.87–5.11)
RBC: 3.82 MIL/uL — ABNORMAL LOW (ref 3.87–5.11)
RDW: 13.1 % (ref 11.5–15.5)
WBC: 10.1 10*3/uL (ref 4.0–10.5)
WBC: 11.9 10*3/uL — ABNORMAL HIGH (ref 4.0–10.5)

## 2013-01-22 MED ORDER — ONDANSETRON HCL 4 MG PO TABS: 4.0000 mg | ORAL_TABLET | ORAL | Status: DC | PRN

## 2013-01-22 MED ORDER — ONDANSETRON HCL 4 MG/2ML IJ SOLN: 4.0000 mg | INTRAMUSCULAR | Status: DC | PRN

## 2013-01-22 MED ORDER — SENNOSIDES-DOCUSATE SODIUM 8.6-50 MG PO TABS: 2.0000 | ORAL_TABLET | Freq: Every day | ORAL | Status: DC

## 2013-01-22 MED ORDER — DIPHENHYDRAMINE HCL 25 MG PO CAPS: 25.0000 mg | ORAL_CAPSULE | Freq: Four times a day (QID) | ORAL | Status: DC | PRN

## 2013-01-22 MED ORDER — ZOLPIDEM TARTRATE 5 MG PO TABS: 5.0000 mg | ORAL_TABLET | Freq: Every evening | ORAL | Status: DC | PRN

## 2013-01-22 MED ORDER — SIMETHICONE 80 MG PO CHEW: 80.0000 mg | CHEWABLE_TABLET | ORAL | Status: DC | PRN

## 2013-01-22 MED ORDER — FENTANYL 2.5 MCG/ML BUPIVACAINE 1/10 % EPIDURAL INFUSION (WH - ANES)
INTRAMUSCULAR | Status: DC | PRN
  Administered 2013-01-22: 14 mL/h via EPIDURAL

## 2013-01-22 MED ORDER — OXYCODONE-ACETAMINOPHEN 5-325 MG PO TABS: 1.0000 | ORAL_TABLET | ORAL | Status: DC | PRN

## 2013-01-22 MED ORDER — FENTANYL 2.5 MCG/ML BUPIVACAINE 1/10 % EPIDURAL INFUSION (WH - ANES)
14.0000 mL/h | INTRAMUSCULAR | Status: DC | PRN
  Filled 2013-01-22: qty 125

## 2013-01-22 MED ORDER — WITCH HAZEL-GLYCERIN EX PADS: 1.0000 "application " | MEDICATED_PAD | CUTANEOUS | Status: DC | PRN

## 2013-01-22 MED ORDER — PHENYLEPHRINE 40 MCG/ML (10ML) SYRINGE FOR IV PUSH (FOR BLOOD PRESSURE SUPPORT): 80.0000 ug | PREFILLED_SYRINGE | INTRAVENOUS | Status: DC | PRN

## 2013-01-22 MED ORDER — EPHEDRINE 5 MG/ML INJ
10.0000 mg | INTRAVENOUS | Status: DC | PRN
  Filled 2013-01-22: qty 4

## 2013-01-22 MED ORDER — LIDOCAINE HCL (PF) 1 % IJ SOLN
INTRAMUSCULAR | Status: DC | PRN
  Administered 2013-01-22 (×2): 9 mL

## 2013-01-22 MED ORDER — PHENYLEPHRINE 40 MCG/ML (10ML) SYRINGE FOR IV PUSH (FOR BLOOD PRESSURE SUPPORT)
80.0000 ug | PREFILLED_SYRINGE | INTRAVENOUS | Status: DC | PRN
  Filled 2013-01-22: qty 5

## 2013-01-22 MED ORDER — DIBUCAINE 1 % RE OINT: 1.0000 "application " | TOPICAL_OINTMENT | RECTAL | Status: DC | PRN

## 2013-01-22 MED ORDER — LANOLIN HYDROUS EX OINT: TOPICAL_OINTMENT | CUTANEOUS | Status: DC | PRN

## 2013-01-22 MED ORDER — LACTATED RINGERS IV SOLN
500.0000 mL | Freq: Once | INTRAVENOUS | Status: AC
  Administered 2013-01-22: 500 mL via INTRAVENOUS

## 2013-01-22 MED ORDER — BENZOCAINE-MENTHOL 20-0.5 % EX AERO: 1.0000 "application " | INHALATION_SPRAY | CUTANEOUS | Status: DC | PRN

## 2013-01-22 MED ORDER — TETANUS-DIPHTH-ACELL PERTUSSIS 5-2.5-18.5 LF-MCG/0.5 IM SUSP: 0.5000 mL | Freq: Once | INTRAMUSCULAR | Status: DC

## 2013-01-22 MED ORDER — PRENATAL MULTIVITAMIN CH
1.0000 | ORAL_TABLET | Freq: Every day | ORAL | Status: DC
  Administered 2013-01-22 – 2013-01-23 (×2): 1 via ORAL
  Filled 2013-01-22 (×2): qty 1

## 2013-01-22 MED ORDER — DIPHENHYDRAMINE HCL 50 MG/ML IJ SOLN: 12.5000 mg | INTRAMUSCULAR | Status: DC | PRN

## 2013-01-22 MED ORDER — IBUPROFEN 600 MG PO TABS
600.0000 mg | ORAL_TABLET | Freq: Four times a day (QID) | ORAL | Status: DC
  Administered 2013-01-22 – 2013-01-23 (×6): 600 mg via ORAL
  Filled 2013-01-22 (×6): qty 1

## 2013-01-22 MED ORDER — EPHEDRINE 5 MG/ML INJ: 10.0000 mg | INTRAVENOUS | Status: DC | PRN

## 2013-01-22 NOTE — Anesthesia Postprocedure Evaluation (Signed)
Anesthesia Post Note  Patient: Meagan Campos  Procedure(s) Performed: * No procedures listed *  Anesthesia type: Epidural  Patient location: Mother/Baby  Post pain: Pain level controlled  Post assessment: Post-op Vital signs reviewed  Last Vitals:  Filed Vitals:   01/22/13 1354  BP: 123/70  Pulse: 97  Temp: 36.8 C  Resp: 20    Post vital signs: Reviewed  Level of consciousness:alert  Complications: No apparent anesthesia complications

## 2013-01-22 NOTE — Progress Notes (Signed)
Patient ID: Meagan Campos, female   DOB: 1986/06/28, 27 y.o.   MRN: [id number]  S:   Called to room by RN. Pt c/o intermittent drooping of R eyelid and facial numbness. Started during labor. Lasts a few seconds. Feels like R eyelid closing on own. No tearing. No speech changes. No extremity numbness/tingling. No LOC. No vision changes or headache. Pt's mother had bells' palsy after delivery so pt worried about this.  Filed Vitals:   01/22/13 0646 01/22/13 0701 01/22/13 0716 01/22/13 0731  BP: 134/80 110/96 124/81 118/88  Pulse: 95 89 77 89  Temp:   98.4 F (36.9 C)   TempSrc:   Oral   Resp: 16 18 18 20   Height:      Weight:      SpO2:        Exam: Alert, oriented, no distress. Sitting in bed eating breakfast. NCAT, EOMI, eyes, eyebrows, forehead, smile symmetric.   A/P 27 y.o. [id number] s/p SVD at [redacted]w[redacted]d and IOL for GHTN and decreased fetal growth (EFW 18%, AC 5%). BPs have been controlled, labs normal. Suspect facial tic/spasm. No other concerning symptoms. Patient advised to notify MD or RN if symptoms persist or worsen.  Napoleon Form, MD

## 2013-01-22 NOTE — Progress Notes (Signed)
Meagan Campos is a 27 y.o. G3P1011 at [redacted]w[redacted]d   Subjective: Labored relatively quickly after cytotec x 2 and now with pressure; recently got epid but has not taken effect  Objective: BP 140/49  Pulse 35  Temp(Src) 97.7 F (36.5 C) (Oral)  Resp 18  Ht 5\' 4"  (1.626 m)  Wt 158 lb (71.668 kg)  BMI 27.11 kg/m2  SpO2 94%  LMP 05/07/2012      FHT:  FHR: 120 bpm, variability: moderate,  accelerations:  Present,  decelerations:  Absent UC:   regular, every 2-3 minutes spontaneous SVE: complete/vtx +1 to +2; AROM for clear fluid  Labs: Lab Results  Component Value Date   WBC 10.1 01/22/2013   HGB 10.6* 01/22/2013   HCT 31.9* 01/22/2013   MCV 80.6 01/22/2013   PLT 196 01/22/2013    Assessment / Plan: IUP at term Transition  Will begin pushing with urge Expect SVD  Meagan Campos 01/22/2013, 6:14 AM

## 2013-01-22 NOTE — Anesthesia Procedure Notes (Signed)
Epidural Patient location during procedure: OB Start time: 01/22/2013 5:17 AM End time: 01/22/2013 5:21 AM  Staffing Anesthesiologist: Sandrea Hughs Performed by: anesthesiologist   Preanesthetic Checklist Completed: patient identified, site marked, surgical consent, pre-op evaluation, timeout performed, IV checked, risks and benefits discussed and monitors and equipment checked  Epidural Patient position: sitting Prep: site prepped and draped and DuraPrep Patient monitoring: continuous pulse ox and blood pressure Approach: midline Injection technique: LOR air  Needle:  Needle type: Tuohy  Needle gauge: 17 G Needle length: 9 cm and 9 Needle insertion depth: 5 cm cm Catheter type: closed end flexible Catheter size: 19 Gauge Catheter at skin depth: 10 cm Test dose: negative and Other  Assessment Sensory level: T8 Events: blood not aspirated, injection not painful, no injection resistance, negative IV test and no paresthesia  Additional Notes Reason for block:procedure for pain

## 2013-01-22 NOTE — Anesthesia Preprocedure Evaluation (Signed)
Anesthesia Evaluation  Patient identified by MRN, date of birth, ID band Patient awake    Reviewed: Allergy & Precautions, H&P , NPO status , Patient's Chart, lab work & pertinent test results  Airway Mallampati: I TM Distance: >3 FB Neck ROM: full    Dental no notable dental hx.    Pulmonary neg pulmonary ROS,    Pulmonary exam normal       Cardiovascular     Neuro/Psych negative psych ROS   GI/Hepatic negative GI ROS, Neg liver ROS,   Endo/Other  negative endocrine ROS  Renal/GU negative Renal ROS  negative genitourinary   Musculoskeletal negative musculoskeletal ROS (+)   Abdominal Normal abdominal exam  (+)   Peds negative pediatric ROS (+)  Hematology negative hematology ROS (+)   Anesthesia Other Findings   Reproductive/Obstetrics (+) Pregnancy                           Anesthesia Physical Anesthesia Plan  ASA: II  Anesthesia Plan: Epidural   Post-op Pain Management:    Induction:   Airway Management Planned:   Additional Equipment:   Intra-op Plan:   Post-operative Plan:   Informed Consent: I have reviewed the patients History and Physical, chart, labs and discussed the procedure including the risks, benefits and alternatives for the proposed anesthesia with the patient or authorized representative who has indicated his/her understanding and acceptance.     Plan Discussed with:   Anesthesia Plan Comments:         Anesthesia Quick Evaluation

## 2013-01-23 MED ORDER — IBUPROFEN 600 MG PO TABS: 600.0000 mg | ORAL_TABLET | Freq: Four times a day (QID) | ORAL | Status: DC

## 2013-01-23 MED ORDER — RHO D IMMUNE GLOBULIN 1500 UNIT/2ML IJ SOLN
300.0000 ug | Freq: Once | INTRAMUSCULAR | Status: AC
  Administered 2013-01-23: 300 ug via INTRAMUSCULAR
  Filled 2013-01-23: qty 2

## 2013-01-23 NOTE — Discharge Summary (Signed)
Obstetric Discharge Summary Meagan Campos is a 27 y.o. [id number] presenting at [redacted]w[redacted]d for induction of labor due to gestational hypertension. PIH labs were normal with protein:creatinine ratio of 0.08. Her blood pressures remained in the mild or normal range throughout labor and postpartum and no magnesium was given. She has a normal SVD with uncomplicated postpartum course. She is breastfeeding and plans Nexplanon or Mirena for contraception.  Reason for Admission: induction of labor and for [facility] Prenatal Procedures: none Intrapartum Procedures: spontaneous vaginal delivery Postpartum Procedures: none Complications-Operative and Postpartum: none Hemoglobin  Date Value Range Status  01/22/2013 10.0* 12.0 - 15.0 g/dL Final     HCT  Date Value Range Status  01/22/2013 30.5* 36.0 - 46.0 % Final    Physical Exam:  General: alert, cooperative and no distress Lochia: appropriate Uterine Fundus: firm Incision: N/A DVT Evaluation: No evidence of DVT seen on physical exam. Negative Homan's sign. No cords or calf tenderness.  Discharge Diagnoses: Term Pregnancy-delivered and IOL for GHTN  Discharge Information: Date: 01/23/2013 Activity: unrestricted Diet: routine Medications: PNV and Ibuprofen Condition: stable Instructions: refer to practice specific booklet Discharge to: home Birthcontrol: nexplanon vs IUD Breastfeeding  Follow-up Information   Follow up with [facility] [place]. (call for postpartum appt tomorrow)    Contact information:   [address] [place] [address] [phone]      Newborn Data: Live born female  Birth Weight: 6 lb 10 oz (3005 g) APGAR: 9, 9  Home with mother.  Kevin Fenton 01/23/2013, 7:40 AM  I saw and examined patient and reviewed prenatal records, delivery summary, labs, vitals and medications. I agree with above resident note. Patient will follow up at [facility] in  [place].  Napoleon Form, MD

## 2013-01-24 LAB — RH IG WORKUP (INCLUDES ABO/RH): Gestational Age(Wks): 37.1

## 2013-01-25 LAB — TYPE AND SCREEN
ABO/RH(D): A NEG
Antibody Screen: POSITIVE
Unit division: 0

## 2013-03-10 ENCOUNTER — Ambulatory Visit (INDEPENDENT_AMBULATORY_CARE_PROVIDER_SITE_OTHER): Payer: Managed Care, Other (non HMO) | Admitting: Obstetrics & Gynecology

## 2013-03-10 ENCOUNTER — Encounter: Payer: Self-pay | Admitting: Obstetrics & Gynecology

## 2013-03-10 DIAGNOSIS — Z1151 Encounter for screening for human papillomavirus (HPV): Secondary | ICD-10-CM

## 2013-03-10 DIAGNOSIS — R6889 Other general symptoms and signs: Secondary | ICD-10-CM

## 2013-03-10 DIAGNOSIS — IMO0002 Reserved for concepts with insufficient information to code with codable children: Secondary | ICD-10-CM

## 2013-03-10 NOTE — Progress Notes (Signed)
  Subjective:    Patient ID: Meagan Campos, female    DOB: 1986-10-09, 27 y.o.   MRN: [id number]  HPI  27 yo P2 6 weeks pp s/p NSVD with no tears. She has no complaints today. She has decided that she would like another Mirena for birth control. She used Mirena between her 2 pregnancies. She had her first period about 4 days ago. She has already had sex and used condoms. She reports normal bowel and bladder function. She reports that the baby is growing well.  Review of Systems     Objective:   Physical Exam  Normal perineum, vulva, vagina Pap obtained NSSA, NT, mobile, normal adnexal exam      Assessment & Plan:  PP- doing well RTC in the near future for Mirena insertion LGSIL 8/13- repeat pap, HPV cotesting today

## 2013-03-15 ENCOUNTER — Ambulatory Visit (INDEPENDENT_AMBULATORY_CARE_PROVIDER_SITE_OTHER): Payer: Managed Care, Other (non HMO) | Admitting: Obstetrics & Gynecology

## 2013-03-15 ENCOUNTER — Encounter: Payer: Self-pay | Admitting: Obstetrics & Gynecology

## 2013-03-15 VITALS — BP 132/75 | HR 84 | Resp 16 | Ht 66.0 in | Wt 143.0 lb

## 2013-03-15 DIAGNOSIS — Z975 Presence of (intrauterine) contraceptive device: Secondary | ICD-10-CM

## 2013-03-15 DIAGNOSIS — Z30430 Encounter for insertion of intrauterine contraceptive device: Secondary | ICD-10-CM

## 2013-03-15 DIAGNOSIS — T8389XA Other specified complication of genitourinary prosthetic devices, implants and grafts, initial encounter: Secondary | ICD-10-CM

## 2013-03-15 DIAGNOSIS — Z01812 Encounter for preprocedural laboratory examination: Secondary | ICD-10-CM

## 2013-03-15 LAB — POCT URINE PREGNANCY: Preg Test, Ur: NEGATIVE

## 2013-03-15 MED ORDER — LEVONORGESTREL 20 MCG/24HR IU IUD
INTRAUTERINE_SYSTEM | Freq: Once | INTRAUTERINE | Status: AC
  Administered 2013-03-15: 15:00:00 via INTRAUTERINE

## 2013-03-15 NOTE — Addendum Note (Signed)
Addended by: Mariel Aloe L on: 03/15/2013 03:00 PM   Modules accepted: Orders

## 2013-03-15 NOTE — Progress Notes (Signed)
  Subjective:    Patient ID: Meagan Campos, female    DOB: 10-12-1986, 27 y.o.   MRN: [id number]  HPI  She is here today for Mirena insertion.  Review of Systems Pap results not available today.    Objective:   Physical Exam   UPT negative, consent signed, Time out procedure done. Cervix prepped with betadine and grasped with a single tooth tenaculum. Mirena was easily placed and the strings were cut to 3-4 cm. Uterus sounded to 9 cm. She tolerated the procedure well.      Assessment & Plan:  Contraception- Mirena RTC 1 year/prn sooner Follow up on Pap results

## 2013-04-21 ENCOUNTER — Ambulatory Visit: Payer: Managed Care, Other (non HMO) | Admitting: Obstetrics & Gynecology

## 2013-09-13 ENCOUNTER — Ambulatory Visit: Payer: Managed Care, Other (non HMO) | Admitting: Obstetrics & Gynecology

## 2013-09-13 DIAGNOSIS — Z30431 Encounter for routine checking of intrauterine contraceptive device: Secondary | ICD-10-CM

## 2014-03-20 IMAGING — US US OB DETAIL+14 WK
1 series · 12 of 28 positions shown · non-contrast
Comparison: none

[Series 1: us ob detail +14 wk · 12 of 101 slices shown]
[im 4/101]
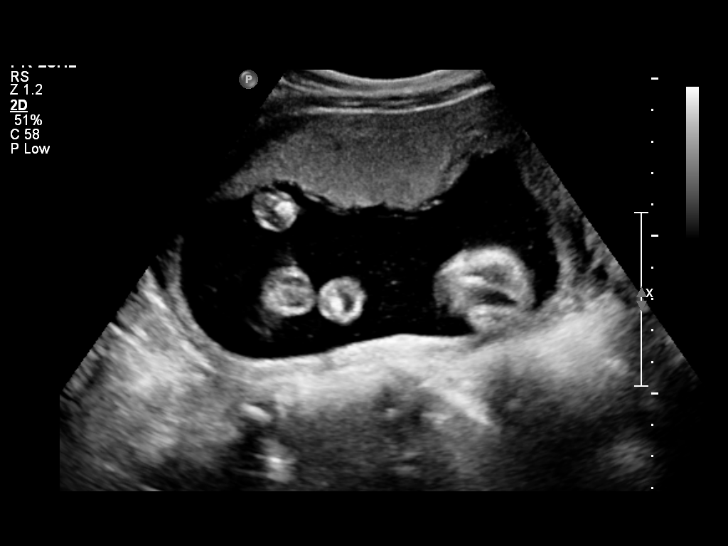
[im 12/101]
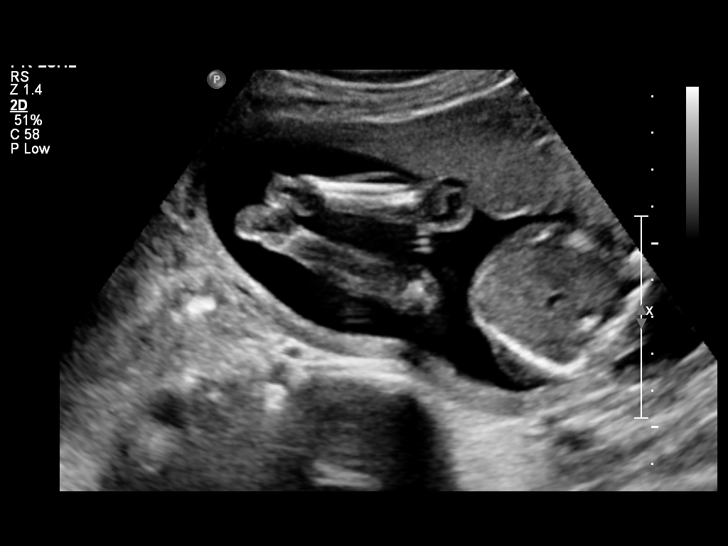
[im 19/101]
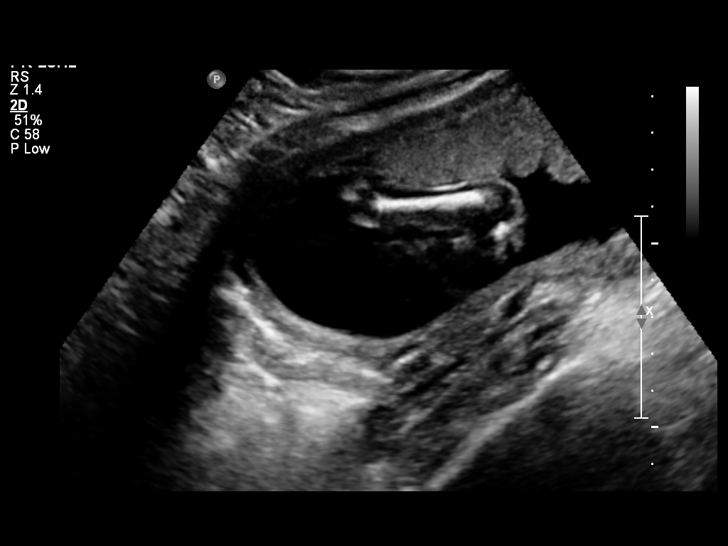
[im 30/101]
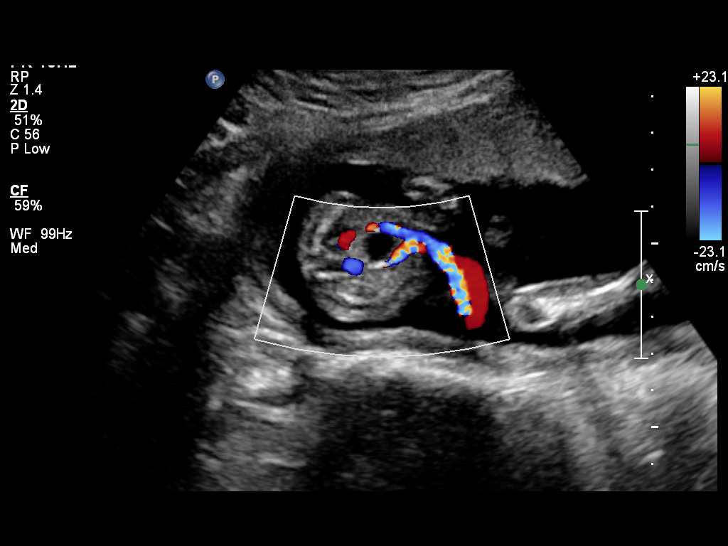
[im 38/101]
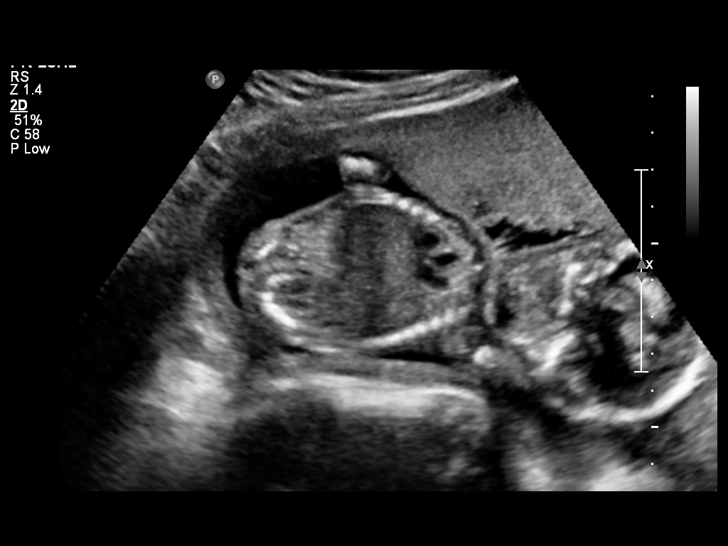
[im 45/101]
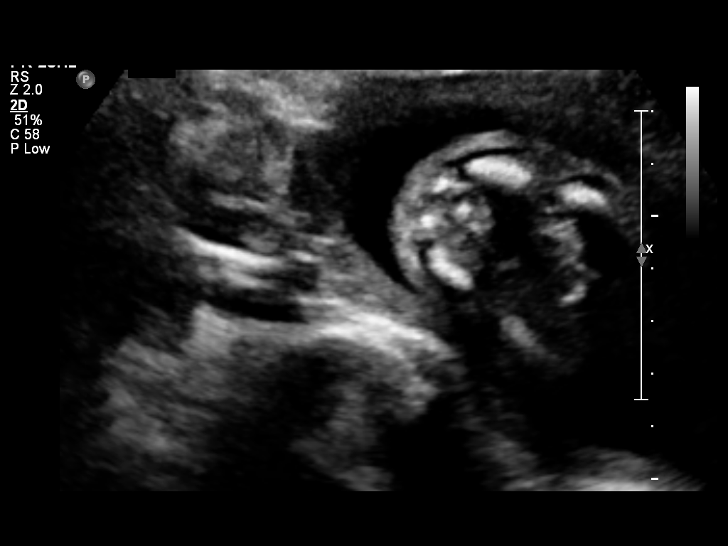
[im 56/101]
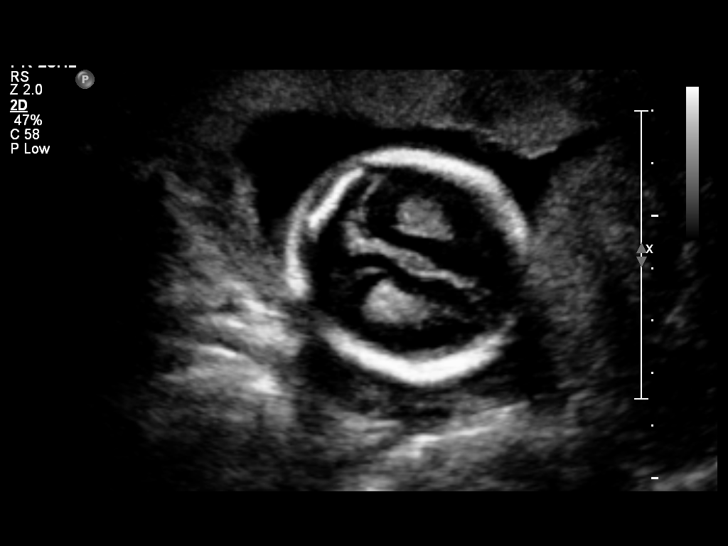
[im 63/101]
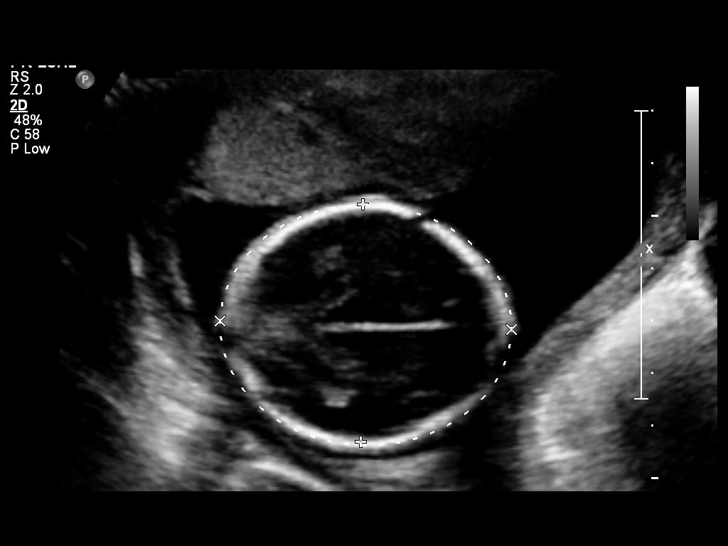
[im 71/101]
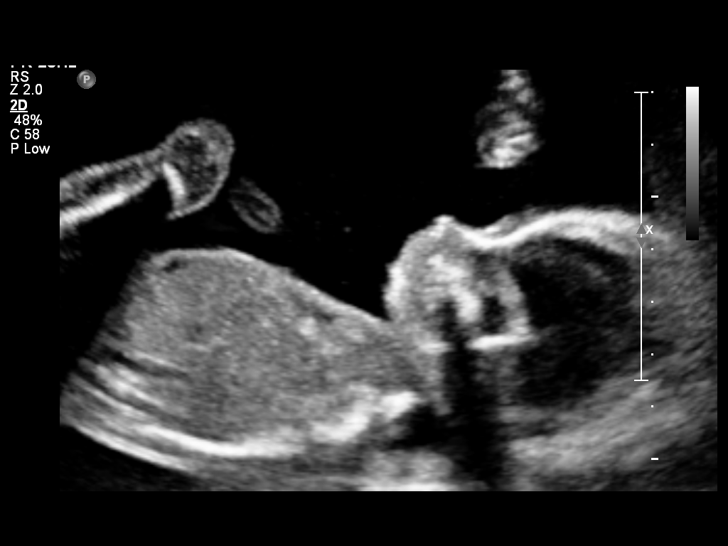
[im 82/101]
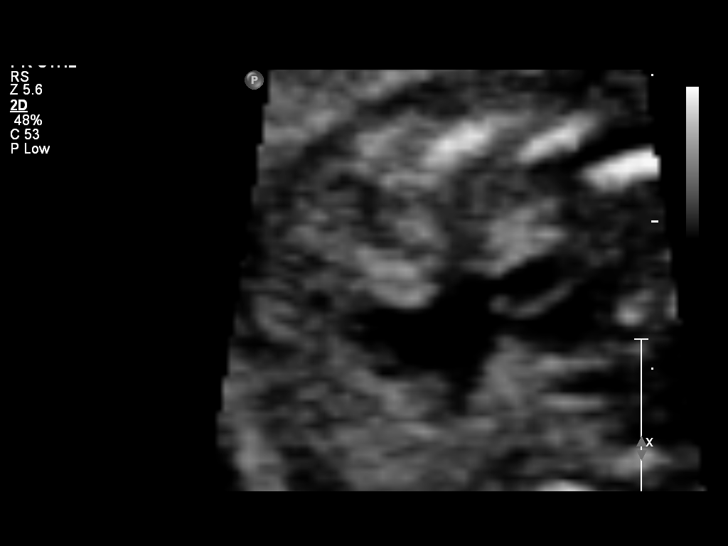
[im 89/101]
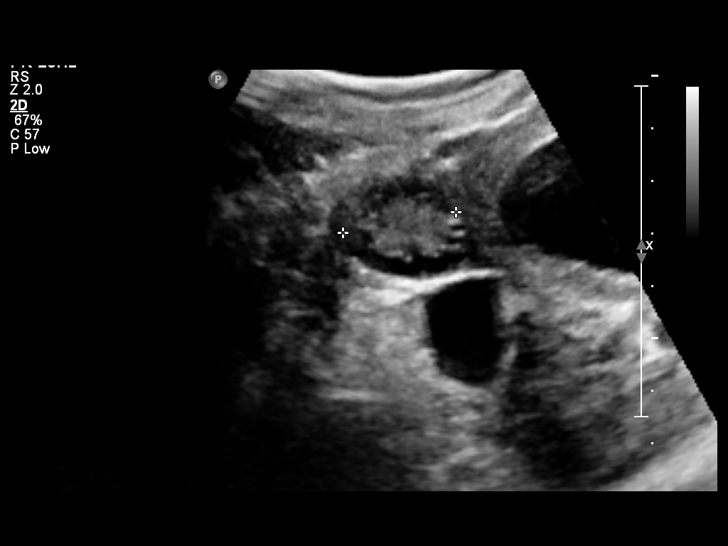
[im 97/101]
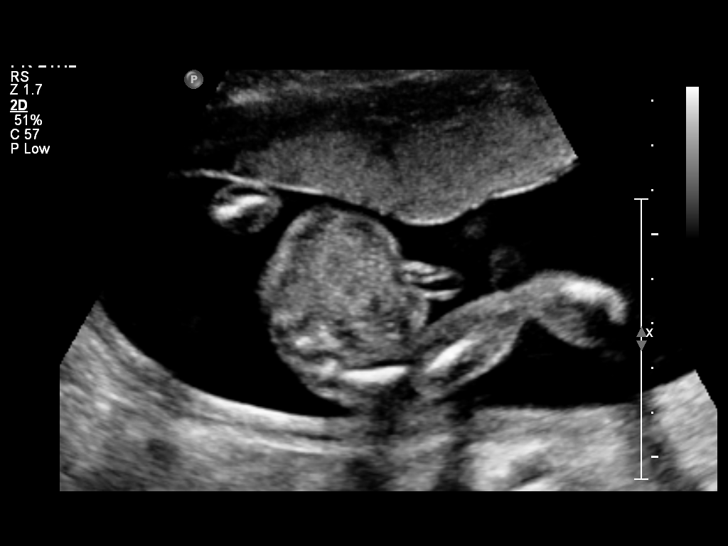

[12 of 28 positions shown; findings below may reference images not displayed]

OBSTETRICS REPORT
                      (Signed Final 09/21/2012 [DATE])

Service(s) Provided

 US OB DETAIL + 14 WK                                  76811.0
Indications

 Detailed fetal anatomic survey
Fetal Evaluation

 Num Of Fetuses:    1
 Fetal Heart Rate:  142                         bpm
 Cardiac Activity:  Observed
 Presentation:      Cephalic
 Placenta:          Anterior, above cervical os
 P. Cord            Visualized
 Insertion:

 Amniotic Fluid
 AFI FV:      Subjectively within normal limits
                                             Larg Pckt:   4.96   cm
Biometry

 BPD:       45  mm    G. Age:   19w 4d                CI:        77.26   70 - 86
                                                      FL/HC:      19.2   16.8 -

 HC:     162.1  mm    G. Age:   19w 0d       19  %    HC/AC:      1.17   1.09 -

 AC:     138.5  mm    G. Age:   19w 2d       35  %    FL/BPD:
 FL:      31.2  mm    G. Age:   19w 5d       47  %    FL/AC:      22.5   20 - 24
 HUM:     30.1  mm    G. Age:   20w 0d       61  %
 CER:     19.2  mm    G. Age:   18w 4d       25  %
 NFT:     3.57  mm

 Est. FW:     292  gm    0 lb 10 oz      45  %
Gestational Age

 LMP:           19w 4d       Date:   05/07/12                 EDD:   02/11/13
 U/S Today:     19w 3d                                        EDD:   02/12/13
 Best:          19w 4d    Det. By:   LMP  (05/07/12)          EDD:   02/11/13
2nd Trimester Genetic Sonogram - Trisomy 21 Screening

 Age:                                             24          Risk=1:   885
 Structural anomalies (inc. cardiac):             No
 Echogenic bowel:                                 No
 Hypoplastic / absent midphalanx 5th Digit:       No
 Pyelectasis:                                     No
 2-vessel umbilical cord:                         No
 Echogenic cardiac foci:                          No
Anatomy

 Cranium:          Appears normal         Aortic Arch:      Not well visualized
 Fetal Cavum:      Appears normal         Ductal Arch:      Appears normal
 Ventricles:       Appears normal         Diaphragm:        Appears normal
 Choroid Plexus:   Appears normal         Stomach:          Appears normal
 Cerebellum:       Appears normal         Abdomen:          Appears normal
 Posterior Fossa:  Appears normal         Abdominal Wall:   Appears nml (cord
                                                            insert, abd wall)
 Nuchal Fold:      Appears normal         Cord Vessels:     Appears normal (3
                                                            vessel cord)
 Face:             Appears normal         Kidneys:          Appear normal
                   (orbits and profile)
 Lips:             Appears normal         Bladder:          Appears normal
 Heart:            Appears normal         Spine:            Appears normal
                   (4CH, axis, and
                   situs)
 RVOT:             Appears normal         Lower             Appears normal
                                          Extremities:
 LVOT:             Appears normal         Upper             Appears normal
                                          Extremities:

 Other:  Nasal bone visualized. Heels and 5th digit visualized. Male gender.
         Technically difficult due to fetal position.
Targeted Anatomy

 Fetal Central Nervous System
 Cisterna Magna:
Cervix Uterus Adnexa

 Cervical Length:   4.2       cm

 Cervix:       Normal appearance by transabdominal scan.
 Left Ovary:   Within normal limits.
 Right Ovary:  Within normal limits.
Impression

 Assigned GA is currently 19w 4d.   Appropriate interval fetal
 growth.
 No fetal anomalies seen involving visualized anatomy.
 No sonographic markers for aneuploidy visualized.

## 2014-09-11 ENCOUNTER — Encounter: Payer: Self-pay | Admitting: Obstetrics & Gynecology

## 2014-12-05 ENCOUNTER — Encounter: Payer: Self-pay | Admitting: Obstetrics & Gynecology

## 2014-12-05 ENCOUNTER — Ambulatory Visit (INDEPENDENT_AMBULATORY_CARE_PROVIDER_SITE_OTHER): Payer: 59 | Admitting: Obstetrics & Gynecology

## 2014-12-05 VITALS — BP 141/93 | HR 84 | Resp 16 | Ht 66.0 in | Wt 147.0 lb

## 2014-12-05 DIAGNOSIS — Z0000 Encounter for general adult medical examination without abnormal findings: Secondary | ICD-10-CM

## 2014-12-05 DIAGNOSIS — Z30431 Encounter for routine checking of intrauterine contraceptive device: Secondary | ICD-10-CM

## 2014-12-05 DIAGNOSIS — R102 Pelvic and perineal pain: Secondary | ICD-10-CM

## 2014-12-05 DIAGNOSIS — Z113 Encounter for screening for infections with a predominantly sexual mode of transmission: Secondary | ICD-10-CM

## 2014-12-05 DIAGNOSIS — Z124 Encounter for screening for malignant neoplasm of cervix: Secondary | ICD-10-CM

## 2014-12-05 DIAGNOSIS — Z30011 Encounter for initial prescription of contraceptive pills: Secondary | ICD-10-CM

## 2014-12-05 DIAGNOSIS — Z1151 Encounter for screening for human papillomavirus (HPV): Secondary | ICD-10-CM

## 2014-12-05 MED ORDER — NORETHINDRONE ACET-ETHINYL EST 1-20 MG-MCG PO TABS: 1.0000 | ORAL_TABLET | Freq: Every day | ORAL | Status: DC

## 2014-12-05 NOTE — Progress Notes (Signed)
   Subjective:    Patient ID: Meagan Campos, female    DOB: 08/12/1986, 29 y.o.   MRN: [id number]  HPI  29 yo MW P2 (27 yo and 426 yo kids) is here today with the complaint of "Passing out" from pelvic pain. Very intense, stabbing pain. Lasts about 90 minutes, spontaneously resolves.  She has her second Mirena for contraception. She used a [employer] fast streamerMirena after her first delivery, kept it in for 3 years. During that time she had amenorrhea but migraines and no pelvic pain. She has the Mirena removed for this reason.   This current pain started about a year ago and occurs every few months. She has some irregular periods. She denies dyspareunia.  Review of Systems She got pregnant while on OCPs (second child)    Objective:   Physical Exam WNWHWFNAD Abd- benign Breathing -normal Neuro- normal NSSmidplane, NT, mobile, normal adnexal exam        Assessment & Plan:  Intermittent pelvic pain- check gyn u/s, check cervical cultures Preventative- Pap smear per her request She will go ahead and start OCPs today in case she decides to have the Mirena removed at her follow up visit. Generic Lo estrin e prescribed

## 2014-12-08 LAB — CYTOLOGY - PAP

## 2014-12-13 ENCOUNTER — Ambulatory Visit (INDEPENDENT_AMBULATORY_CARE_PROVIDER_SITE_OTHER): Payer: 59

## 2014-12-13 DIAGNOSIS — R102 Pelvic and perineal pain: Secondary | ICD-10-CM

## 2014-12-18 ENCOUNTER — Telehealth: Payer: Self-pay | Admitting: *Deleted

## 2014-12-18 ENCOUNTER — Ambulatory Visit (INDEPENDENT_AMBULATORY_CARE_PROVIDER_SITE_OTHER): Payer: 59 | Admitting: Obstetrics & Gynecology

## 2014-12-18 ENCOUNTER — Encounter: Payer: Self-pay | Admitting: Obstetrics & Gynecology

## 2014-12-18 VITALS — BP 141/94 | HR 81 | Resp 16 | Ht 66.0 in | Wt 148.0 lb

## 2014-12-18 DIAGNOSIS — B373 Candidiasis of vulva and vagina: Secondary | ICD-10-CM

## 2014-12-18 DIAGNOSIS — B3731 Acute candidiasis of vulva and vagina: Secondary | ICD-10-CM

## 2014-12-18 DIAGNOSIS — Z30432 Encounter for removal of intrauterine contraceptive device: Secondary | ICD-10-CM

## 2014-12-18 DIAGNOSIS — R102 Pelvic and perineal pain: Secondary | ICD-10-CM

## 2014-12-18 MED ORDER — FLUCONAZOLE 150 MG PO TABS: 150.0000 mg | ORAL_TABLET | Freq: Once | ORAL | Status: DC

## 2014-12-18 NOTE — Progress Notes (Signed)
   Subjective:    Patient ID: Meagan Campos, female    DOB: 11/04/1986, 29 y.o.   MRN: [id number]  HPI 29 yo P2 here to have her Mirena IUD removed. She started her OCPs last month. She has no complaints.   Review of Systems Pap smear normal 1/16    Objective:   Physical Exam WNWHWFNAD Breathing normally Her Mirena was easily removed. She tolerated the procedure well.      Assessment & Plan:  Contraception- Continue OCPs

## 2014-12-18 NOTE — Telephone Encounter (Signed)
Pt notified of pap results and RX for Diflucan sent to her pharmacy.  She  Is scheduled for a removal of Mirena today.

## 2014-12-18 NOTE — Telephone Encounter (Signed)
-----   Message from Allie BossierMyra C Dove, MD sent at 12/15/2014  9:20 AM EST ----- She will need a pap in a year and difluclan 150 mg po once if she wants to for yeast. Thanks

## 2015-02-28 ENCOUNTER — Telehealth: Payer: Self-pay | Admitting: *Deleted

## 2015-02-28 DIAGNOSIS — Z3041 Encounter for surveillance of contraceptive pills: Secondary | ICD-10-CM

## 2015-02-28 MED ORDER — LEVONORGESTREL-ETHINYL ESTRAD 0.15-30 MG-MCG PO TABS: 1.0000 | ORAL_TABLET | Freq: Every day | ORAL | Status: DC

## 2015-02-28 NOTE — Telephone Encounter (Signed)
Pt called requesting that her OCP be changed to [facility] because she will not have a co-pay with it.  Per Dr Marice Potterove may send a RX to [facility].

## 2016-08-27 ENCOUNTER — Other Ambulatory Visit (HOSPITAL_COMMUNITY)
Admission: RE | Admit: 2016-08-27 | Discharge: 2016-08-27 | Disposition: A | Discharge status: Home / Self Care | Payer: Medicaid Other | Source: Ambulatory Visit | Attending: Obstetrics & Gynecology | Admitting: Obstetrics & Gynecology

## 2016-08-27 ENCOUNTER — Encounter: Payer: Self-pay | Admitting: Obstetrics & Gynecology

## 2016-08-27 ENCOUNTER — Ambulatory Visit (INDEPENDENT_AMBULATORY_CARE_PROVIDER_SITE_OTHER): Payer: Medicaid Other | Admitting: Obstetrics & Gynecology

## 2016-08-27 ENCOUNTER — Encounter (INDEPENDENT_AMBULATORY_CARE_PROVIDER_SITE_OTHER): Payer: Self-pay

## 2016-08-27 VITALS — BP 126/78 | HR 84 | Wt 125.0 lb

## 2016-08-27 DIAGNOSIS — Z113 Encounter for screening for infections with a predominantly sexual mode of transmission: Secondary | ICD-10-CM | POA: Insufficient documentation

## 2016-08-27 DIAGNOSIS — Z3689 Encounter for other specified antenatal screening: Secondary | ICD-10-CM | POA: Diagnosis not present

## 2016-08-27 DIAGNOSIS — Z3491 Encounter for supervision of normal pregnancy, unspecified, first trimester: Secondary | ICD-10-CM | POA: Diagnosis not present

## 2016-08-27 DIAGNOSIS — Z8759 Personal history of other complications of pregnancy, childbirth and the puerperium: Secondary | ICD-10-CM

## 2016-08-27 NOTE — Progress Notes (Signed)
Subjective:    Meagan Campos is a [id number] 5867w5d being seen today for her first obstetrical visit.  Her obstetrical history is significant for gestational hypertension in last pregnancy--induced at 37 weeks.. Patient does intend to breast feed. Pregnancy history fully reviewed.  Patient reports no complaints.  Vitals:   08/27/16 1529  BP: 126/78  Pulse: 84  Weight: 125 lb (56.7 kg)    HISTORY: OB History  Gravida Para Term Preterm AB Living  4 2 2   1 2   SAB TAB Ectopic Multiple Live Births          2    # Outcome Date GA Lbr Len/2nd Weight Sex Delivery Anes PTL Lv  4 Current           3 Term 01/22/13 8268w1d 03:39 / 00:09  M Vag-Spont EPI  LIV  2 AB           1 Term  640w0d  7 lb 5 oz (3.317 kg) F Vag-Spont EPI  LIV     Past Medical History:  Diagnosis Date  . Abnormal Pap smear   . Anemia   . Migraine   . Supervision of normal pregnancy 07/01/2012   [place] First Screen ordered (NT normal, first screen normal) MSAFP normal  Anatomic US   Glucose Screen   GBS   Feeding Preference Breast Feeding  Contraception   Circumcision        Past Surgical History:  Procedure Laterality Date  . TONSILLECTOMY  age 89   Family History  Problem Relation Age of Onset  . Cancer Maternal Grandmother     ovarian  . Cancer Paternal Grandmother     lung  . Cancer Paternal Grandfather     colon, prostate     Exam    Uterus:     Pelvic Exam:    Perineum: No Hemorrhoids   Vulva: normal   Vagina:  normal mucosa   pH: n/a   Cervix: no lesions   Adnexa: normal adnexa   Bony Pelvis: gynecoid  System: Breast:  normal appearance, no masses or tenderness   Skin: normal coloration and turgor, no rashes    Neurologic: oriented, normal mood   Extremities: normal strength, tone, and muscle mass   HEENT sclera clear, anicteric, oropharynx clear, no lesions, neck supple with midline trachea, thyroid without masses and trachea midline   Mouth/Teeth mucous membranes  moist, pharynx normal without lesions and dental hygiene good   Neck supple and no masses   Cardiovascular: regular rate and rhythm   Respiratory:  appears well, vitals normal, no respiratory distress, acyanotic, normal RR, ear and throat exam is normal, chest clear, no wheezing, crepitations, rhonchi, normal symmetric air entry   Abdomen: soft, non-tender; bowel sounds normal; no masses,  no organomegaly   Urinary: urethral meatus normal      Assessment:    Pregnancy: [id number] Patient Active Problem List   Diagnosis Date Noted  . Gestational hypertension 12/08/2012  . Threatened preterm labor, antepartum 12/01/2012  . Abnormal glucose tolerance test in pregnancy, antepartum 11/17/2012  . Headaches in pregnancy, antepartum 11/16/2012  . Anemia complicating pregnancy 07/13/2012  . Rh negative status during pregnancy, antepartum 07/05/2012  . Supervision of normal pregnancy 07/01/2012  . Perineal folliculitis 01/09/2012  . 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 UTI (lower urinary tract infection) 07/04/2011        Plan:     Initial labs drawn. Prenatal vitamins. Problem list reviewed  and updated. Genetic Screening discussed First Screen: ordered.  Ultrasound discussed; fetal survey: requested.  Follow up in 4 weeks.  Baby scipts reduced visit patient. Baby aspirin daily at 13 weeks to prevent Pre E Pap up to date (ASCUS, HPV neg) Husand considering vasectomy   Jaykob Minichiello H. 08/27/2016

## 2016-08-27 NOTE — Patient Instructions (Signed)
BABYSCRIPTS PATIENT: [x ] initial, [ ]  12, [ ]  20, [ ]  28, [ ]  32, [ ]  36, [ ]  38, [ ]  39, [ ]  40  Pt to return @ week 13 instead of week 12

## 2016-08-27 NOTE — Progress Notes (Signed)
Bedside U/S shows IUP with FHT of 185 BPM and CRL is 29.741mm  GA  5260w5d  Smal SCH noted but pt denies any bleeding

## 2016-08-28 LAB — PRENATAL PROFILE (SOLSTAS)
Antibody Screen: NEGATIVE
BASOS ABS: 0 {cells}/uL (ref 0–200)
Basophils Relative: 0 %
Eosinophils Absolute: 65 cells/uL (ref 15–500)
Eosinophils Relative: 1 %
HEMATOCRIT: 37.7 % (ref 35.0–45.0)
HEP B S AG: NEGATIVE
HIV: NONREACTIVE
Hemoglobin: 12.2 g/dL (ref 11.7–15.5)
LYMPHS ABS: 1625 {cells}/uL (ref 850–3900)
Lymphocytes Relative: 25 %
MCH: 29.8 pg (ref 27.0–33.0)
MCHC: 32.4 g/dL (ref 32.0–36.0)
MCV: 92 fL (ref 80.0–100.0)
MONO ABS: 585 {cells}/uL (ref 200–950)
MPV: 9 fL (ref 7.5–12.5)
Monocytes Relative: 9 %
NEUTROS PCT: 65 %
Neutro Abs: 4225 cells/uL (ref 1500–7800)
Platelets: 265 10*3/uL (ref 140–400)
RBC: 4.1 MIL/uL (ref 3.80–5.10)
RDW: 13.1 % (ref 11.0–15.0)
RUBELLA: 1.19 {index} — AB (ref <0.90)
Rh Type: NEGATIVE
WBC: 6.5 10*3/uL (ref 3.8–10.8)

## 2016-08-28 LAB — HEMOGLOBIN A1C
Hgb A1c MFr Bld: 4.6 % (ref <5.7)
MEAN PLASMA GLUCOSE: 85 mg/dL

## 2016-08-28 LAB — GLUCOSE, RANDOM: Glucose, Bld: 76 mg/dL (ref 65–99)

## 2016-08-29 LAB — URINE CYTOLOGY ANCILLARY ONLY
Chlamydia: NEGATIVE
NEISSERIA GONORRHEA: NEGATIVE

## 2016-08-29 LAB — CULTURE, URINE COMPREHENSIVE: ORGANISM ID, BACTERIA: NO GROWTH

## 2016-09-01 ENCOUNTER — Encounter: Payer: Self-pay | Admitting: Obstetrics & Gynecology

## 2016-09-01 DIAGNOSIS — O0990 Supervision of high risk pregnancy, unspecified, unspecified trimester: Secondary | ICD-10-CM | POA: Insufficient documentation

## 2016-09-05 ENCOUNTER — Encounter (HOSPITAL_COMMUNITY): Payer: Self-pay | Admitting: Obstetrics & Gynecology

## 2016-09-17 ENCOUNTER — Ambulatory Visit (HOSPITAL_COMMUNITY)
Admission: RE | Admit: 2016-09-17 | Discharge: 2016-09-17 | Disposition: A | Discharge status: Home / Self Care | Payer: Medicaid Other | Source: Ambulatory Visit | Attending: Obstetrics & Gynecology | Admitting: Obstetrics & Gynecology

## 2016-09-17 ENCOUNTER — Encounter (HOSPITAL_COMMUNITY): Payer: Self-pay

## 2016-09-17 DIAGNOSIS — Z3A12 12 weeks gestation of pregnancy: Secondary | ICD-10-CM | POA: Insufficient documentation

## 2016-09-17 DIAGNOSIS — Z3491 Encounter for supervision of normal pregnancy, unspecified, first trimester: Secondary | ICD-10-CM

## 2016-09-17 DIAGNOSIS — Z3682 Encounter for antenatal screening for nuchal translucency: Secondary | ICD-10-CM | POA: Insufficient documentation

## 2016-09-19 ENCOUNTER — Ambulatory Visit (INDEPENDENT_AMBULATORY_CARE_PROVIDER_SITE_OTHER): Payer: Medicaid Other | Admitting: Advanced Practice Midwife

## 2016-09-19 VITALS — BP 120/73 | HR 86 | Wt 124.0 lb

## 2016-09-19 DIAGNOSIS — Z3482 Encounter for supervision of other normal pregnancy, second trimester: Secondary | ICD-10-CM

## 2016-09-19 DIAGNOSIS — Z3A19 19 weeks gestation of pregnancy: Secondary | ICD-10-CM

## 2016-09-19 NOTE — Progress Notes (Signed)
   PRENATAL VISIT NOTE  Subjective:  Meagan Campos is a 30 y.o. [phone] at 854w0d being seen today for ongoing prenatal care.  She is currently monitored for the following issues for this low-risk pregnancy and has CIN I (cervical intraepithelial neoplasia I); Rh negative status during pregnancy, antepartum; Headaches in pregnancy, antepartum; History of gestational hypertension; and Supervision of low-risk pregnancy on her problem list.  Patient reports no complaints.   . Vag. Bleeding: None.  Movement: Absent. Denies leaking of fluid.   The following portions of the patient's history were reviewed and updated as appropriate: allergies, current medications, past family history, past medical history, past social history, past surgical history and problem list. Problem list updated.  Objective:   Vitals:   09/19/16 0858  BP: 120/73  Pulse: 86  Weight: 124 lb (56.2 kg)    Fetal Status: Fetal Heart Rate (bpm): 158   Movement: Absent     General:  Alert, oriented and cooperative. Patient is in no acute distress.  Skin: Skin is warm and dry. No rash noted.   Cardiovascular: Normal heart rate noted  Respiratory: Normal respiratory effort, no problems with respiration noted  Abdomen: Soft, gravid, appropriate for gestational age. Pain/Pressure: Absent     Pelvic:  Cervical exam deferred        Extremities: Normal range of motion.  Edema: None  Mental Status: Normal mood and affect. Normal behavior. Normal judgment and thought content.   Assessment and Plan:  Pregnancy: [id number] at 754w0d  1. Normal pregnancy in multigravida in second trimester --Anticipatory guidance about next visits, second trimester of pregnancy, etc. - US MFM OB COMP + 14 WK; Future  2. [redacted] weeks gestation of pregnancy  - US MFM OB COMP + 14 WK; Future  Preterm labor symptoms and general obstetric precautions including but not limited to vaginal bleeding, contractions, leaking of fluid and fetal movement were  reviewed in detail with the patient. Please refer to After Visit Summary for other counseling recommendations.  Return in about 4 weeks (around 10/17/2016).   Hurshel PartyLisa A Leftwich-Kirby, CNM

## 2016-09-19 NOTE — Patient Instructions (Signed)

## 2016-09-23 ENCOUNTER — Encounter: Payer: Self-pay | Admitting: *Deleted

## 2016-09-26 ENCOUNTER — Other Ambulatory Visit (HOSPITAL_COMMUNITY): Payer: Self-pay

## 2016-10-11 ENCOUNTER — Telehealth: Payer: Self-pay | Admitting: Family Medicine

## 2016-10-11 NOTE — Telephone Encounter (Signed)
Got call from [facility] I.e. BP 170/70--does not sound correct. Called patient who re-checked her BP twice and got BP 118/68 and 125/72

## 2016-10-20 ENCOUNTER — Encounter: Payer: Medicaid Other | Admitting: Obstetrics & Gynecology

## 2016-10-28 ENCOUNTER — Ambulatory Visit (INDEPENDENT_AMBULATORY_CARE_PROVIDER_SITE_OTHER): Payer: Medicaid Other | Admitting: Obstetrics & Gynecology

## 2016-10-28 VITALS — BP 118/70 | HR 89 | Wt 132.0 lb

## 2016-10-28 DIAGNOSIS — Z8759 Personal history of other complications of pregnancy, childbirth and the puerperium: Secondary | ICD-10-CM

## 2016-10-28 DIAGNOSIS — Z23 Encounter for immunization: Secondary | ICD-10-CM

## 2016-10-28 DIAGNOSIS — Z6791 Unspecified blood type, Rh negative: Secondary | ICD-10-CM

## 2016-10-28 DIAGNOSIS — Z3492 Encounter for supervision of normal pregnancy, unspecified, second trimester: Secondary | ICD-10-CM

## 2016-10-28 DIAGNOSIS — O26899 Other specified pregnancy related conditions, unspecified trimester: Secondary | ICD-10-CM

## 2016-10-28 NOTE — Progress Notes (Signed)
   PRENATAL VISIT NOTE  Subjective:  Meagan GravelBrooke L Wilkinson is a 30 y.o. MW [phone] at 1127w4d being seen today for ongoing prenatal care.  She is currently monitored for the following issues for this low-risk pregnancy and has CIN I (cervical intraepithelial neoplasia I); Rh negative, antepartum; Headaches in pregnancy, antepartum; History of gestational hypertension; and Supervision of low-risk pregnancy on her problem list.  Patient reports no complaints. Yesterday her right breast was sore and red, reminded her of mastitis. But these symptoms have resolved as of today.  Contractions: Not present. Vag. Bleeding: None.  Movement: (!) Decreased. Denies leaking of fluid.   The following portions of the patient's history were reviewed and updated as appropriate: allergies, current medications, past family history, past medical history, past social history, past surgical history and problem list. Problem list updated.  Objective:   Vitals:   10/28/16 1429  BP: 118/70  Pulse: 89  Weight: 132 lb (59.9 kg)    Fetal Status: Fetal Heart Rate (bpm): 157   Movement: (!) Decreased     General:  Alert, oriented and cooperative. Patient is in no acute distress.  Skin: Skin is warm and dry. No rash noted.   Cardiovascular: Normal heart rate noted  Respiratory: Normal respiratory effort, no problems with respiration noted  Abdomen: Soft, gravid, appropriate for gestational age. Pain/Pressure: Present     Pelvic:  Cervical exam deferred        Extremities: Normal range of motion.  Edema: None  Mental Status: Normal mood and affect. Normal behavior. Normal judgment and thought content.  Breast exam normal BL, dense breasts Assessment and Plan:  Pregnancy: [id number] at 3627w4d  1. Encounter for supervision of low-risk pregnancy in second trimester - flu vaccine today - MSAFP today - anatomy u/s this week  2. Rh negative, antepartum   3. History of gestational hypertension   Preterm labor symptoms  and general obstetric precautions including but not limited to vaginal bleeding, contractions, leaking of fluid and fetal movement were reviewed in detail with the patient. Please refer to After Visit Summary for other counseling recommendations.  No Follow-up on file.   Allie BossierMyra C Annaliesa Blann, MD

## 2016-10-28 NOTE — Progress Notes (Signed)
Pt states she was feeling baby movement everyday but, lately she doesn't feel it as much

## 2016-10-31 ENCOUNTER — Ambulatory Visit (HOSPITAL_COMMUNITY)
Admission: RE | Admit: 2016-10-31 | Discharge: 2016-10-31 | Disposition: A | Discharge status: Home / Self Care | Payer: Medicaid Other | Source: Ambulatory Visit | Attending: Advanced Practice Midwife | Admitting: Advanced Practice Midwife

## 2016-10-31 DIAGNOSIS — Z3482 Encounter for supervision of other normal pregnancy, second trimester: Secondary | ICD-10-CM | POA: Insufficient documentation

## 2016-10-31 DIAGNOSIS — Z3A19 19 weeks gestation of pregnancy: Secondary | ICD-10-CM | POA: Diagnosis not present

## 2016-10-31 LAB — ALPHA FETOPROTEIN, MATERNAL
AFP: 38.3 ng/mL
CURR GEST AGE: 18.6 wk
MoM for AFP: 0.75
Open Spina bifida: NEGATIVE
Osb Risk: <1:27300 {titer}

## 2016-11-06 ENCOUNTER — Encounter: Payer: Medicaid Other | Admitting: Obstetrics & Gynecology

## 2016-11-10 NOTE — L&D Delivery Note (Signed)
Delivery Note At 5:11 PM a viable female was delivered via Vaginal, Spontaneous Delivery (Presentation: LOA ). No nuchal cord, anterior shoulder delivered easily followed by rest of body. APGAR: 8, 9; weight pending.   Placenta status: intact, delivered spontaneously.  Cord:  3VC Anesthesia:  Epidural Episiotomy: None Lacerations: None Suture Repair: n/a Est. Blood Loss (mL): 250  Mom to postpartum.  Baby to Couplet care / Skin to Skin.  Meagan Campos 03/06/2017, 5:29 PM  Patient is a [id number] at [redacted]w[redacted]d who was admitted for IOL due to gHTN, but otherwise uncomplicated prenatal course.  She progressed with augmentation via Pit/AROM.  I was gloved and present for delivery in its entirety.  Second stage of labor progressed, baby delivered after two contractions.  No decels during second stage noted.  Complications: none  Lacerations: none  EBL: 250cc  Meagan Campos, CNM 5:41 PM 03/06/2017

## 2016-12-05 ENCOUNTER — Telehealth: Payer: Self-pay | Admitting: Obstetrics & Gynecology

## 2016-12-05 NOTE — Telephone Encounter (Signed)
Faculty Practice OB/GYN Attending Phone Call Documentation  I received a call from [facility] with reports of elevated BP in 140-150s/80-90s for Meagan Campos.  Patient was called and current BP is 124/74.  Denies any headaches, visual changes, RUQ/epigastric pain.  Reports good FM. Preeclampsia precautions reviewed with patient, she was told to come in to MAU for any worsening symptoms. She will continue to monitor BP at home for now.    Jaynie CollinsUGONNA  Amariyana Heacox, MD, FACOG Attending Obstetrician & Gynecologist, [facility], [facility]

## 2016-12-08 ENCOUNTER — Other Ambulatory Visit (HOSPITAL_COMMUNITY)
Admission: RE | Admit: 2016-12-08 | Discharge: 2016-12-08 | Disposition: A | Discharge status: Home / Self Care | Payer: Medicaid Other | Source: Ambulatory Visit | Attending: Obstetrics & Gynecology | Admitting: Obstetrics & Gynecology

## 2016-12-08 ENCOUNTER — Ambulatory Visit (INDEPENDENT_AMBULATORY_CARE_PROVIDER_SITE_OTHER): Payer: Medicaid Other | Admitting: Obstetrics & Gynecology

## 2016-12-08 VITALS — BP 140/81 | HR 93 | Wt 143.0 lb

## 2016-12-08 DIAGNOSIS — O139 Gestational [pregnancy-induced] hypertension without significant proteinuria, unspecified trimester: Secondary | ICD-10-CM | POA: Insufficient documentation

## 2016-12-08 DIAGNOSIS — Z3A24 24 weeks gestation of pregnancy: Secondary | ICD-10-CM | POA: Insufficient documentation

## 2016-12-08 DIAGNOSIS — O169 Unspecified maternal hypertension, unspecified trimester: Secondary | ICD-10-CM

## 2016-12-08 DIAGNOSIS — N898 Other specified noninflammatory disorders of vagina: Secondary | ICD-10-CM | POA: Diagnosis not present

## 2016-12-08 DIAGNOSIS — O10912 Unspecified pre-existing hypertension complicating pregnancy, second trimester: Secondary | ICD-10-CM | POA: Diagnosis not present

## 2016-12-08 DIAGNOSIS — O26892 Other specified pregnancy related conditions, second trimester: Secondary | ICD-10-CM | POA: Diagnosis present

## 2016-12-08 DIAGNOSIS — Z3492 Encounter for supervision of normal pregnancy, unspecified, second trimester: Secondary | ICD-10-CM

## 2016-12-08 DIAGNOSIS — O132 Gestational [pregnancy-induced] hypertension without significant proteinuria, second trimester: Secondary | ICD-10-CM

## 2016-12-08 LAB — CBC
HCT: 34.3 % — ABNORMAL LOW (ref 35.0–45.0)
HEMOGLOBIN: 11.2 g/dL — AB (ref 11.7–15.5)
MCH: 29.3 pg (ref 27.0–33.0)
MCHC: 32.7 g/dL (ref 32.0–36.0)
MCV: 89.8 fL (ref 80.0–100.0)
MPV: 9.9 fL (ref 7.5–12.5)
Platelets: 311 10*3/uL (ref 140–400)
RBC: 3.82 MIL/uL (ref 3.80–5.10)
RDW: 13.2 % (ref 11.0–15.0)
WBC: 8 10*3/uL (ref 3.8–10.8)

## 2016-12-08 NOTE — Progress Notes (Signed)
   PRENATAL VISIT NOTE  Subjective:  Meagan Campos is a 31 y.o. [id number] at 2950w3d being seen today for ongoing prenatal care.  She is currently monitored for the following issues for this high-risk pregnancy and has CIN I (cervical intraepithelial neoplasia I); Rh negative, antepartum; Headaches in pregnancy, antepartum; History of gestational hypertension; Supervision of low-risk pregnancy; and Gestational hypertension, antepartum on her problem list.  Patient reports elevated BP on baby scripts, contractions, vaginal discharge.  No current HA, scotomata, RUQ pain.  Contractions: Irritability. Vag. Bleeding: None.  Movement: Present. Denies leaking of fluid.   The following portions of the patient's history were reviewed and updated as appropriate: allergies, current medications, past family history, past medical history, past social history, past surgical history and problem list. Problem list updated.  Objective:   Vitals:   12/08/16 1353  BP: 140/81  Pulse: 93  Weight: 143 lb (64.9 kg)    Fetal Status: Fetal Heart Rate (bpm): 155   Movement: Present     General:  Alert, oriented and cooperative. Patient is in no acute distress.  Skin: Skin is warm and dry. No rash noted.   Cardiovascular: Normal heart rate noted  Respiratory: Normal respiratory effort, no problems with respiration noted  Abdomen: Soft, gravid, appropriate for gestational age. Pain/Pressure: Present     Pelvic:  Cervical exam performed        Extremities: Normal range of motion.  Edema: None  Mental Status: Normal mood and affect. Normal behavior. Normal judgment and thought content.   Assessment and Plan:  Pregnancy: [id number] at 5350w3d  1. Encounter for supervision of low-risk pregnancy in second trimester - Growth and Fluid - US MFM OB FOLLOW UP; Future  2. Hypertension during pregnancy, antepartum, unspecified hypertension in pregnancy type - US MFM OB FOLLOW UP; Future - Protein / creatinine ratio,  urine - Comprehensive metabolic panel - CBC 24 hour urine over the weekend to be done Take off baby scripts.  Pt to take daily BP.  Call with 160/105 (either number) Pre E precautions given. -Betamethasone if BP continues to be elevated.  3. Preterm contractions Cervix closed - Fetal fibronectin - Cervicovaginal ancillary only--for vaginal discharge  Preterm labor symptoms and general obstetric precautions including but not limited to vaginal bleeding, contractions, leaking of fluid and fetal movement were reviewed in detail with the patient. Please refer to After Visit Summary for other counseling recommendations.  Return in about 1 week (around 12/15/2016).   Lesly DukesKelly H Zorawar Strollo, MD

## 2016-12-08 NOTE — Progress Notes (Signed)
On [facility] and having spikes of elevated BP's.  Experiencing HA's

## 2016-12-09 LAB — COMPREHENSIVE METABOLIC PANEL
ALT: 6 U/L (ref 6–29)
AST: 10 U/L (ref 10–30)
Albumin: 3.4 g/dL — ABNORMAL LOW (ref 3.6–5.1)
Alkaline Phosphatase: 42 U/L (ref 33–115)
BILIRUBIN TOTAL: 0.2 mg/dL (ref 0.2–1.2)
BUN: 10 mg/dL (ref 7–25)
CALCIUM: 8.6 mg/dL (ref 8.6–10.2)
CHLORIDE: 104 mmol/L (ref 98–110)
CO2: 26 mmol/L (ref 20–31)
Creat: 0.53 mg/dL (ref 0.50–1.10)
GLUCOSE: 80 mg/dL (ref 65–99)
POTASSIUM: 3.6 mmol/L (ref 3.5–5.3)
Sodium: 138 mmol/L (ref 135–146)
Total Protein: 5.9 g/dL — ABNORMAL LOW (ref 6.1–8.1)

## 2016-12-09 LAB — PROTEIN / CREATININE RATIO, URINE
Creatinine, Urine: 39 mg/dL (ref 20–320)
Protein Creatinine Ratio: 103 mg/g creat (ref 21–161)
TOTAL PROTEIN, URINE: 4 mg/dL — AB (ref 5–24)

## 2016-12-09 LAB — FETAL FIBRONECTIN: FETAL FIBRONECTIN: NEGATIVE

## 2016-12-10 ENCOUNTER — Other Ambulatory Visit: Payer: Self-pay | Admitting: *Deleted

## 2016-12-10 ENCOUNTER — Telehealth: Payer: Self-pay | Admitting: *Deleted

## 2016-12-10 NOTE — Telephone Encounter (Signed)
Pt notified of normal labs and she will still continue to take daily BP's and still bring in her 24 hr urine on Monday.

## 2016-12-10 NOTE — Telephone Encounter (Signed)
-----   Message from Lesly DukesKelly H Leggett, MD sent at 12/10/2016  9:43 AM EST ----- FFN negative--no evidence of preterm labor; labs are normal with no evidence of pre eclampsia.  Still take daily blood pressures.

## 2016-12-10 NOTE — Telephone Encounter (Signed)
Spoke with pt today concerning her Bp's.  The last 2 days they have been 122/88 and 124/90.  Pt will continue to take her Bp and call me on Friday morning.  If Diastolic is elevated will start Betamethasone injections per Dr Penne LashLeggett.

## 2016-12-11 LAB — CERVICOVAGINAL ANCILLARY ONLY
Bacterial vaginitis: NEGATIVE
CANDIDA VAGINITIS: NEGATIVE
TRICH (WINDOWPATH): NEGATIVE

## 2016-12-15 ENCOUNTER — Ambulatory Visit (HOSPITAL_COMMUNITY)
Admission: RE | Admit: 2016-12-15 | Discharge: 2016-12-15 | Disposition: A | Discharge status: Home / Self Care | Payer: Medicaid Other | Source: Ambulatory Visit | Attending: Obstetrics & Gynecology | Admitting: Obstetrics & Gynecology

## 2016-12-15 ENCOUNTER — Ambulatory Visit (INDEPENDENT_AMBULATORY_CARE_PROVIDER_SITE_OTHER): Payer: Medicaid Other | Admitting: Obstetrics & Gynecology

## 2016-12-15 VITALS — BP 128/77 | HR 93 | Wt 146.0 lb

## 2016-12-15 DIAGNOSIS — Z6791 Unspecified blood type, Rh negative: Secondary | ICD-10-CM | POA: Insufficient documentation

## 2016-12-15 DIAGNOSIS — O09292 Supervision of pregnancy with other poor reproductive or obstetric history, second trimester: Secondary | ICD-10-CM | POA: Insufficient documentation

## 2016-12-15 DIAGNOSIS — O169 Unspecified maternal hypertension, unspecified trimester: Secondary | ICD-10-CM

## 2016-12-15 DIAGNOSIS — O0992 Supervision of high risk pregnancy, unspecified, second trimester: Secondary | ICD-10-CM

## 2016-12-15 DIAGNOSIS — Z3A25 25 weeks gestation of pregnancy: Secondary | ICD-10-CM | POA: Insufficient documentation

## 2016-12-15 DIAGNOSIS — O26893 Other specified pregnancy related conditions, third trimester: Secondary | ICD-10-CM | POA: Insufficient documentation

## 2016-12-15 DIAGNOSIS — O139 Gestational [pregnancy-induced] hypertension without significant proteinuria, unspecified trimester: Secondary | ICD-10-CM

## 2016-12-15 DIAGNOSIS — Z3492 Encounter for supervision of normal pregnancy, unspecified, second trimester: Secondary | ICD-10-CM

## 2016-12-15 DIAGNOSIS — O132 Gestational [pregnancy-induced] hypertension without significant proteinuria, second trimester: Secondary | ICD-10-CM | POA: Diagnosis not present

## 2016-12-15 LAB — CBC
HCT: 33.7 % — ABNORMAL LOW (ref 35.0–45.0)
HEMOGLOBIN: 11.2 g/dL — AB (ref 11.7–15.5)
MCH: 29.6 pg (ref 27.0–33.0)
MCHC: 33.2 g/dL (ref 32.0–36.0)
MCV: 89.2 fL (ref 80.0–100.0)
MPV: 9.7 fL (ref 7.5–12.5)
Platelets: 267 10*3/uL (ref 140–400)
RBC: 3.78 MIL/uL — ABNORMAL LOW (ref 3.80–5.10)
RDW: 13.1 % (ref 11.0–15.0)
WBC: 8.5 10*3/uL (ref 3.8–10.8)

## 2016-12-15 NOTE — Progress Notes (Signed)
   PRENATAL VISIT NOTE  Subjective:  Meagan Campos is a 31 y.o. [phone] at 3172w3d being seen today for ongoing prenatal care.  She is currently monitored for the following issues for this high-risk pregnancy and has CIN I (cervical intraepithelial neoplasia I); Rh negative, antepartum; Headaches in pregnancy, antepartum; History of gestational hypertension; Pregnancy, supervision, high-risk; and Gestational hypertension, antepartum on her problem list.  Patient reports no complaints.  Contractions: Not present. Vag. Bleeding: None.  Movement: Present. Denies leaking of fluid.   The following portions of the patient's history were reviewed and updated as appropriate: allergies, current medications, past family history, past medical history, past social history, past surgical history and problem list. Problem list updated.  Objective:   Vitals:   12/15/16 1319  BP: 128/77  Pulse: 93  Weight: 146 lb (66.2 kg)    Fetal Status: Fetal Heart Rate (bpm): 160 Fundal Height: 26 cm Movement: Present     General:  Alert, oriented and cooperative. Patient is in no acute distress.  Skin: Skin is warm and dry. No rash noted.   Cardiovascular: Normal heart rate noted  Respiratory: Normal respiratory effort, no problems with respiration noted  Abdomen: Soft, gravid, appropriate for gestational age. Pain/Pressure: Present     Pelvic:  Cervical exam deferred        Extremities: Normal range of motion.  Edema: Trace  Mental Status: Normal mood and affect. Normal behavior. Normal judgment and thought content.   Assessment and Plan:  Pregnancy: [id number] at 5472w3d  1. Gestational hypertension, antepartum Elevated BP last visit. Growth scan scheduled today. Will follow up labs and ultrasound. - Protein, Urine, 24 hour - Comprehensive metabolic panel - CBC - Creatinine Clearance, Urine, 24 hour  2. Supervision of high risk pregnancy in second trimester Preterm labor symptoms and general obstetric  precautions including but not limited to vaginal bleeding, contractions, leaking of fluid and fetal movement were reviewed in detail with the patient. Please refer to After Visit Summary for other counseling recommendations.  Return in about 2 weeks (around 12/29/2016) for 2 hr GTT, 3rd trimester labs, TDap, Rhogam, OB visit.   Tereso NewcomerUgonna A Shaynna Husby, MD

## 2016-12-15 NOTE — Patient Instructions (Signed)
Return to clinic for any scheduled appointments or obstetric concerns, or go to [facility]U for evaluation  

## 2016-12-16 LAB — COMPREHENSIVE METABOLIC PANEL
ALK PHOS: 40 U/L (ref 33–115)
ALT: 6 U/L (ref 6–29)
AST: 9 U/L — ABNORMAL LOW (ref 10–30)
Albumin: 3.5 g/dL — ABNORMAL LOW (ref 3.6–5.1)
BUN: 8 mg/dL (ref 7–25)
CALCIUM: 8.7 mg/dL (ref 8.6–10.2)
CO2: 25 mmol/L (ref 20–31)
Chloride: 104 mmol/L (ref 98–110)
Creat: 0.57 mg/dL (ref 0.50–1.10)
Glucose, Bld: 111 mg/dL — ABNORMAL HIGH (ref 65–99)
POTASSIUM: 3.4 mmol/L — AB (ref 3.5–5.3)
Sodium: 138 mmol/L (ref 135–146)
Total Bilirubin: 0.3 mg/dL (ref 0.2–1.2)
Total Protein: 5.8 g/dL — ABNORMAL LOW (ref 6.1–8.1)

## 2016-12-16 LAB — CREATININE CLEARANCE W/O EGFR
Body Surface Area: 1.75
CREATININE CLEARANCE: 156 mL/min — AB (ref 75–115)
CREATININE, URINE: 144 mg/dL (ref 20–320)
CREATININE: 0.57 mg/dL (ref 0.50–1.10)
Creatinine, 24H Ur: 1.3 g/(24.h) (ref 0.63–2.50)
HEIGHT FEET: 5 ft
Height Inches: 6 [in_us]
Weight Pounds: 146 [lb_av]

## 2016-12-16 LAB — PROTEIN, URINE, 24 HOUR
PROTEIN, URINE: 16 mg/dL (ref 5–24)
Protein, 24H Urine: 144 mg/24 h (ref <150)

## 2016-12-23 ENCOUNTER — Telehealth: Payer: Self-pay | Admitting: *Deleted

## 2016-12-23 NOTE — Telephone Encounter (Signed)
Pt notified of normal growth U/S and will schedule for a repeat in 4 weeks.

## 2016-12-23 NOTE — Telephone Encounter (Signed)
-----   Message from Lesly DukesKelly H Leggett, MD sent at 12/20/2016  3:06 PM EST ----- Nml growth.  Needs f/u growth in 4 weeks

## 2016-12-29 ENCOUNTER — Ambulatory Visit (INDEPENDENT_AMBULATORY_CARE_PROVIDER_SITE_OTHER): Payer: Medicaid Other | Admitting: Advanced Practice Midwife

## 2016-12-29 VITALS — BP 119/79 | HR 84 | Wt 147.0 lb

## 2016-12-29 DIAGNOSIS — O26893 Other specified pregnancy related conditions, third trimester: Secondary | ICD-10-CM

## 2016-12-29 DIAGNOSIS — Z23 Encounter for immunization: Secondary | ICD-10-CM

## 2016-12-29 DIAGNOSIS — Z6791 Unspecified blood type, Rh negative: Secondary | ICD-10-CM

## 2016-12-29 DIAGNOSIS — O360920 Maternal care for other rhesus isoimmunization, second trimester, not applicable or unspecified: Secondary | ICD-10-CM

## 2016-12-29 DIAGNOSIS — O0992 Supervision of high risk pregnancy, unspecified, second trimester: Secondary | ICD-10-CM

## 2016-12-29 DIAGNOSIS — Z3A27 27 weeks gestation of pregnancy: Secondary | ICD-10-CM

## 2016-12-29 LAB — CBC
HCT: 35.6 % (ref 35.0–45.0)
Hemoglobin: 11.4 g/dL — ABNORMAL LOW (ref 11.7–15.5)
MCH: 28.4 pg (ref 27.0–33.0)
MCHC: 32 g/dL (ref 32.0–36.0)
MCV: 88.8 fL (ref 80.0–100.0)
MPV: 9.4 fL (ref 7.5–12.5)
PLATELETS: 289 10*3/uL (ref 140–400)
RBC: 4.01 MIL/uL (ref 3.80–5.10)
RDW: 13.1 % (ref 11.0–15.0)
WBC: 7.4 10*3/uL (ref 3.8–10.8)

## 2016-12-29 MED ORDER — RHO D IMMUNE GLOBULIN 1500 UNIT/2ML IJ SOSY
300.0000 ug | PREFILLED_SYRINGE | Freq: Once | INTRAMUSCULAR | Status: AC
  Administered 2016-12-29: 300 ug via INTRAMUSCULAR

## 2016-12-29 NOTE — Progress Notes (Signed)
   PRENATAL VISIT NOTE  Subjective:  Marina GravelBrooke L Wilkinson is a 31 y.o. [phone] at 3412w3d being seen today for ongoing prenatal care.  She is currently monitored for the following issues for this high-risk pregnancy and has CIN I (cervical intraepithelial neoplasia I); Rh negative, antepartum; Headaches in pregnancy, antepartum; History of gestational hypertension; Pregnancy, supervision, high-risk; and Gestational hypertension, antepartum on her problem list.  Patient reports no complaints.   .  .   . Denies leaking of fluid.   The following portions of the patient's history were reviewed and updated as appropriate: allergies, current medications, past family history, past medical history, past social history, past surgical history and problem list. Problem list updated.  Objective:  There were no vitals filed for this visit.  Fetal Status:           General:  Alert, oriented and cooperative. Patient is in no acute distress.  Skin: Skin is warm and dry. No rash noted.   Cardiovascular: Normal heart rate noted  Respiratory: Normal respiratory effort, no problems with respiration noted  Abdomen: Soft, gravid, appropriate for gestational age.       Pelvic:  Cervical exam deferred        Extremities: Normal range of motion.     Mental Status: Normal mood and affect. Normal behavior. Normal judgment and thought content.   Assessment and Plan:  Pregnancy: G4P2012 at 3312w3d  1. [redacted] weeks gestation of pregnancy  - CBC - HIV antibody (with reflex) - RPR - Glucose Tolerance, 2 Hours w/1 Hour  Preterm labor symptoms and general obstetric precautions including but not limited to vaginal bleeding, contractions, leaking of fluid and fetal movement were reviewed in detail with the patient. Please refer to After Visit Summary for other counseling recommendations.  RTO 2 weeks  Aviva SignsMarie L Lakendria Nicastro, CNM

## 2016-12-29 NOTE — Patient Instructions (Signed)
Second Trimester of Pregnancy The second trimester is from week 13 through week 28, month 4 through 6. This is often the time in pregnancy that you feel your best. Often times, morning sickness has lessened or quit. You may have more energy, and you may get hungry more often. Your unborn baby (fetus) is growing rapidly. At the end of the sixth month, he or she is about 9 inches long and weighs about 1 pounds. You will likely feel the baby move (quickening) between 18 and 20 weeks of pregnancy. Follow these instructions at home:  Avoid all smoking, herbs, and alcohol. Avoid drugs not approved by your doctor.  Do not use any tobacco products, including cigarettes, chewing tobacco, and electronic cigarettes. If you need help quitting, ask your doctor. You may get counseling or other support to help you quit.  Only take medicine as told by your doctor. Some medicines are safe and some are not during pregnancy.  Exercise only as told by your doctor. Stop exercising if you start having cramps.  Eat regular, healthy meals.  Wear a good support bra if your breasts are tender.  Do not use hot tubs, steam rooms, or saunas.  Wear your seat belt when driving.  Avoid raw meat, uncooked cheese, and liter boxes and soil used by cats.  Take your prenatal vitamins.  Take 1500-2000 milligrams of calcium daily starting at the 20th week of pregnancy until you deliver your baby.  Try taking medicine that helps you poop (stool softener) as needed, and if your doctor approves. Eat more fiber by eating fresh fruit, vegetables, and whole grains. Drink enough fluids to keep your pee (urine) clear or pale yellow.  Take warm water baths (sitz baths) to soothe pain or discomfort caused by hemorrhoids. Use hemorrhoid cream if your doctor approves.  If you have puffy, bulging veins (varicose veins), wear support hose. Raise (elevate) your feet for 15 minutes, 3-4 times a day. Limit salt in your diet.  Avoid heavy  lifting, wear low heals, and sit up straight.  Rest with your legs raised if you have leg cramps or low back pain.  Visit your dentist if you have not gone during your pregnancy. Use a soft toothbrush to brush your teeth. Be gentle when you floss.  You can have sex (intercourse) unless your doctor tells you not to.  Go to your doctor visits. Get help if:  You feel dizzy.  You have mild cramps or pressure in your lower belly (abdomen).  You have a nagging pain in your belly area.  You continue to feel sick to your stomach (nauseous), throw up (vomit), or have watery poop (diarrhea).  You have bad smelling fluid coming from your vagina.  You have pain with peeing (urination). Get help right away if:  You have a fever.  You are leaking fluid from your vagina.  You have spotting or bleeding from your vagina.  You have severe belly cramping or pain.  You lose or gain weight rapidly.  You have trouble catching your breath and have chest pain.  You notice sudden or extreme puffiness (swelling) of your face, hands, ankles, feet, or legs.  You have not felt the baby move in over an hour.  You have severe headaches that do not go away with medicine.  You have vision changes. This information is not intended to replace advice given to you by your health care provider. Make sure you discuss any questions you have with your health care   provider. Document Released: 01/21/2010 Document Revised: 04/03/2016 Document Reviewed: 12/28/2012 Elsevier Interactive Patient Education  2017 [employer]  

## 2016-12-30 ENCOUNTER — Telehealth: Payer: Self-pay

## 2016-12-30 LAB — ANTIBODY SCREEN: ANTIBODY SCREEN: NEGATIVE

## 2016-12-30 LAB — HIV ANTIBODY (ROUTINE TESTING W REFLEX): HIV: NONREACTIVE

## 2016-12-30 LAB — GLUCOSE TOLERANCE, 2 HOURS W/ 1HR
GLUCOSE, FASTING: 72 mg/dL (ref 65–99)
Glucose, 1 hour: 111 mg/dL
Glucose, 2 hour: 97 mg/dL (ref <140)

## 2016-12-30 LAB — RPR

## 2016-12-30 NOTE — Telephone Encounter (Signed)
Left message on pt's voicemail letting her know that she passed her 2 hour GTT

## 2017-01-01 ENCOUNTER — Encounter: Payer: Medicaid Other | Admitting: Obstetrics & Gynecology

## 2017-01-12 ENCOUNTER — Ambulatory Visit (HOSPITAL_COMMUNITY)
Admission: RE | Admit: 2017-01-12 | Discharge: 2017-01-12 | Disposition: A | Discharge status: Home / Self Care | Payer: Medicaid Other | Source: Ambulatory Visit | Attending: Advanced Practice Midwife | Admitting: Advanced Practice Midwife

## 2017-01-12 DIAGNOSIS — O26893 Other specified pregnancy related conditions, third trimester: Secondary | ICD-10-CM | POA: Insufficient documentation

## 2017-01-12 DIAGNOSIS — Z3A29 29 weeks gestation of pregnancy: Secondary | ICD-10-CM | POA: Insufficient documentation

## 2017-01-12 DIAGNOSIS — O132 Gestational [pregnancy-induced] hypertension without significant proteinuria, second trimester: Secondary | ICD-10-CM | POA: Diagnosis not present

## 2017-01-12 DIAGNOSIS — Z6791 Unspecified blood type, Rh negative: Secondary | ICD-10-CM | POA: Insufficient documentation

## 2017-01-12 DIAGNOSIS — O09292 Supervision of pregnancy with other poor reproductive or obstetric history, second trimester: Secondary | ICD-10-CM | POA: Diagnosis not present

## 2017-01-12 DIAGNOSIS — Z362 Encounter for other antenatal screening follow-up: Secondary | ICD-10-CM | POA: Diagnosis not present

## 2017-01-12 DIAGNOSIS — Z3A27 27 weeks gestation of pregnancy: Secondary | ICD-10-CM

## 2017-01-13 ENCOUNTER — Ambulatory Visit (INDEPENDENT_AMBULATORY_CARE_PROVIDER_SITE_OTHER): Payer: Medicaid Other | Admitting: Obstetrics & Gynecology

## 2017-01-13 VITALS — BP 130/78 | HR 93 | Wt 152.0 lb

## 2017-01-13 DIAGNOSIS — O0993 Supervision of high risk pregnancy, unspecified, third trimester: Secondary | ICD-10-CM

## 2017-01-13 DIAGNOSIS — O26899 Other specified pregnancy related conditions, unspecified trimester: Secondary | ICD-10-CM

## 2017-01-13 DIAGNOSIS — O139 Gestational [pregnancy-induced] hypertension without significant proteinuria, unspecified trimester: Secondary | ICD-10-CM

## 2017-01-13 DIAGNOSIS — O133 Gestational [pregnancy-induced] hypertension without significant proteinuria, third trimester: Secondary | ICD-10-CM

## 2017-01-13 DIAGNOSIS — Z6791 Unspecified blood type, Rh negative: Secondary | ICD-10-CM

## 2017-01-13 NOTE — Progress Notes (Signed)
   PRENATAL VISIT NOTE  Subjective:  Meagan GravelBrooke L Campos is a 31 y.o. MW [phone] at 8610w4d being seen today for ongoing prenatal care.  She is currently monitored for the following issues for this low-risk pregnancy and has CIN I (cervical intraepithelial neoplasia I); Rh negative, antepartum; Headaches in pregnancy, antepartum; History of gestational hypertension; Pregnancy, supervision, high-risk; and Gestational hypertension, antepartum on her problem list.  Patient reports no complaints.  Contractions: Irritability. Vag. Bleeding: None.  Movement: Present. Denies leaking of fluid.   The following portions of the patient's history were reviewed and updated as appropriate: allergies, current medications, past family history, past medical history, past social history, past surgical history and problem list. Problem list updated.  Objective:   Vitals:   01/13/17 1316  BP: 130/78  Pulse: 93  Weight: 152 lb (68.9 kg)    Fetal Status:     Movement: Present     General:  Alert, oriented and cooperative. Patient is in no acute distress.  Skin: Skin is warm and dry. No rash noted.   Cardiovascular: Normal heart rate noted  Respiratory: Normal respiratory effort, no problems with respiration noted  Abdomen: Soft, gravid, appropriate for gestational age. Pain/Pressure: Present     Pelvic:  Cervical exam deferred        Extremities: Normal range of motion.  Edema: None  Mental Status: Normal mood and affect. Normal behavior. Normal judgment and thought content.   Assessment and Plan:  Pregnancy: [id number] at 5210w4d  1. Gestational hypertension, antepartum - follow BP  2. Rh negative, antepartum - s/p rhogam  3. Supervision of high risk pregnancy in third trimester   Preterm labor symptoms and general obstetric precautions including but not limited to vaginal bleeding, contractions, leaking of fluid and fetal movement were reviewed in detail with the patient. Please refer to After Visit  Summary for other counseling recommendations.  No Follow-up on file.   Allie BossierMyra C Samyiah Halvorsen, MD

## 2017-01-26 ENCOUNTER — Ambulatory Visit (INDEPENDENT_AMBULATORY_CARE_PROVIDER_SITE_OTHER): Payer: Medicaid Other | Admitting: Obstetrics & Gynecology

## 2017-01-26 VITALS — BP 144/74 | HR 92 | Wt 154.0 lb

## 2017-01-26 DIAGNOSIS — O133 Gestational [pregnancy-induced] hypertension without significant proteinuria, third trimester: Secondary | ICD-10-CM | POA: Diagnosis not present

## 2017-01-26 DIAGNOSIS — O0993 Supervision of high risk pregnancy, unspecified, third trimester: Secondary | ICD-10-CM

## 2017-01-26 DIAGNOSIS — O139 Gestational [pregnancy-induced] hypertension without significant proteinuria, unspecified trimester: Secondary | ICD-10-CM

## 2017-01-26 MED ORDER — ASPIRIN 81 MG PO CHEW: 81.0000 mg | CHEWABLE_TABLET | Freq: Every day | ORAL | 2 refills | Status: DC

## 2017-01-26 NOTE — Progress Notes (Signed)
   PRENATAL VISIT NOTE  Subjective:  Meagan Campos is a 31 y.o. MW [phone] at 729w3d being seen today for ongoing prenatal care.  She is currently monitored for the following issues for this high-risk pregnancy and has CIN I (cervical intraepithelial neoplasia I); Rh negative, antepartum; Headaches in pregnancy, antepartum; History of gestational hypertension; Pregnancy, supervision, high-risk; and Gestational hypertension, antepartum on her problem list.  Patient reports no complaints. She occasionally has visual changes and headaches, none today. She has never had RUQ pain. Contractions: Irritability. Vag. Bleeding: None.  Movement: Present. Denies leaking of fluid.   The following portions of the patient's history were reviewed and updated as appropriate: allergies, current medications, past family history, past medical history, past social history, past surgical history and problem list. Problem list updated.  Objective:   Vitals:   01/26/17 1457  BP: (!) 144/74  Pulse: 92  Weight: 154 lb (69.9 kg)    Fetal Status: Fetal Heart Rate (bpm): 154   Movement: Present     General:  Alert, oriented and cooperative. Patient is in no acute distress.  Skin: Skin is warm and dry. No rash noted.   Cardiovascular: Normal heart rate noted  Respiratory: Normal respiratory effort, no problems with respiration noted  Abdomen: Soft, gravid, appropriate for gestational age. Pain/Pressure: Present     Pelvic:  Cervical exam deferred        Extremities: Normal range of motion.  Edema: None  Mental Status: Normal mood and affect. Normal behavior. Normal judgment and thought content.  DTR 3+ BL  Assessment and Plan:  Pregnancy: [id number] at 5429w3d  1. Supervision of high risk pregnancy in third trimester   2. Gestational hypertension, antepartum - asa 81 mg daily until 36 week - Pre eclampsia precautions - CBC - Comprehensive metabolic panel - Protein / creatinine ratio, urine  Preterm  labor symptoms and general obstetric precautions including but not limited to vaginal bleeding, contractions, leaking of fluid and fetal movement were reviewed in detail with the patient. Please refer to After Visit Summary for other counseling recommendations.  No Follow-up on file.   Allie BossierMyra C Advit Trethewey, MD

## 2017-01-26 NOTE — Progress Notes (Signed)
BP recheck 126/66  P-91

## 2017-01-27 LAB — COMPREHENSIVE METABOLIC PANEL
ALK PHOS: 57 U/L (ref 33–115)
ALT: 6 U/L (ref 6–29)
AST: 13 U/L (ref 10–30)
Albumin: 3.3 g/dL — ABNORMAL LOW (ref 3.6–5.1)
BUN: 6 mg/dL — AB (ref 7–25)
CO2: 25 mmol/L (ref 20–31)
Calcium: 8.8 mg/dL (ref 8.6–10.2)
Chloride: 104 mmol/L (ref 98–110)
Creat: 0.48 mg/dL — ABNORMAL LOW (ref 0.50–1.10)
GLUCOSE: 83 mg/dL (ref 65–99)
Potassium: 3.9 mmol/L (ref 3.5–5.3)
SODIUM: 139 mmol/L (ref 135–146)
Total Bilirubin: 0.3 mg/dL (ref 0.2–1.2)
Total Protein: 5.6 g/dL — ABNORMAL LOW (ref 6.1–8.1)

## 2017-01-27 LAB — CBC
HCT: 31.3 % — ABNORMAL LOW (ref 35.0–45.0)
Hemoglobin: 10.1 g/dL — ABNORMAL LOW (ref 11.7–15.5)
MCH: 27.2 pg (ref 27.0–33.0)
MCHC: 32.3 g/dL (ref 32.0–36.0)
MCV: 84.4 fL (ref 80.0–100.0)
MPV: 10 fL (ref 7.5–12.5)
Platelets: 285 10*3/uL (ref 140–400)
RBC: 3.71 MIL/uL — ABNORMAL LOW (ref 3.80–5.10)
RDW: 13.2 % (ref 11.0–15.0)
WBC: 7.2 10*3/uL (ref 3.8–10.8)

## 2017-01-27 LAB — PROTEIN / CREATININE RATIO, URINE
Creatinine, Urine: 36 mg/dL (ref 20–320)
PROTEIN CREATININE RATIO: 139 mg/g{creat} (ref 21–161)
Total Protein, Urine: 5 mg/dL (ref 5–24)

## 2017-02-02 ENCOUNTER — Ambulatory Visit (INDEPENDENT_AMBULATORY_CARE_PROVIDER_SITE_OTHER): Payer: Medicaid Other | Admitting: Obstetrics & Gynecology

## 2017-02-02 VITALS — BP 125/78 | HR 99 | Wt 155.0 lb

## 2017-02-02 DIAGNOSIS — O0993 Supervision of high risk pregnancy, unspecified, third trimester: Secondary | ICD-10-CM

## 2017-02-02 DIAGNOSIS — O139 Gestational [pregnancy-induced] hypertension without significant proteinuria, unspecified trimester: Secondary | ICD-10-CM

## 2017-02-02 DIAGNOSIS — O133 Gestational [pregnancy-induced] hypertension without significant proteinuria, third trimester: Secondary | ICD-10-CM

## 2017-02-02 NOTE — Progress Notes (Signed)
   PRENATAL VISIT NOTE  Subjective:  Meagan Campos is a 31 y.o. MW [phone] at 7629w3d being seen today for ongoing prenatal care.  She is currently monitored for the following issues for this low-risk pregnancy and has CIN I (cervical intraepithelial neoplasia I); Rh negative, antepartum; Headaches in pregnancy, antepartum; History of gestational hypertension; Pregnancy, supervision, high-risk; and Gestational hypertension, antepartum on her problem list.  Patient reports no complaints.  Contractions: Irritability. Vag. Bleeding: None.  Movement: Present. Denies leaking of fluid.   The following portions of the patient's history were reviewed and updated as appropriate: allergies, current medications, past family history, past medical history, past social history, past surgical history and problem list. Problem list updated.  Objective:   Vitals:   02/02/17 1532  BP: 125/78  Pulse: 99  Weight: 155 lb (70.3 kg)    Fetal Status: Fetal Heart Rate (bpm): 146   Movement: Present     General:  Alert, oriented and cooperative. Patient is in no acute distress.  Skin: Skin is warm and dry. No rash noted.   Cardiovascular: Normal heart rate noted  Respiratory: Normal respiratory effort, no problems with respiration noted  Abdomen: Soft, gravid, appropriate for gestational age. Pain/Pressure: Present     Pelvic:  Cervical exam deferred        Extremities: Normal range of motion.  Edema: None  Mental Status: Normal mood and affect. Normal behavior. Normal judgment and thought content.   Assessment and Plan:  Pregnancy: [id number] at 4329w3d  1. Gestational hypertension, antepartum   2. Supervision of high risk pregnancy in third trimester   Preterm labor symptoms and general obstetric precautions including but not limited to vaginal bleeding, contractions, leaking of fluid and fetal movement were reviewed in detail with the patient. Please refer to After Visit Summary for other counseling  recommendations.  No Follow-up on file.   Allie BossierMyra C Delle Andrzejewski, MD

## 2017-02-02 NOTE — Progress Notes (Signed)
Pt states she has been feeling dizzy.  

## 2017-02-16 ENCOUNTER — Ambulatory Visit (INDEPENDENT_AMBULATORY_CARE_PROVIDER_SITE_OTHER): Payer: Medicaid Other | Admitting: Advanced Practice Midwife

## 2017-02-16 VITALS — BP 121/77 | HR 95 | Wt 158.0 lb

## 2017-02-16 DIAGNOSIS — O133 Gestational [pregnancy-induced] hypertension without significant proteinuria, third trimester: Secondary | ICD-10-CM

## 2017-02-16 DIAGNOSIS — O0993 Supervision of high risk pregnancy, unspecified, third trimester: Secondary | ICD-10-CM

## 2017-02-16 DIAGNOSIS — Z3A35 35 weeks gestation of pregnancy: Secondary | ICD-10-CM

## 2017-02-16 DIAGNOSIS — O139 Gestational [pregnancy-induced] hypertension without significant proteinuria, unspecified trimester: Secondary | ICD-10-CM

## 2017-02-16 NOTE — Patient Instructions (Signed)
Hypertension During Pregnancy °Hypertension, commonly called high blood pressure, is when the force of blood pumping through your arteries is too strong. Arteries are blood vessels that carry blood from the heart throughout the body. Hypertension during pregnancy can cause problems for you and your baby. Your baby may be born early (prematurely) or may not weigh as much as he or she should at birth. Very bad cases of hypertension during pregnancy can be life-threatening. °Different types of hypertension can occur during pregnancy. These include: °· Chronic hypertension. This happens when: °¨ You have hypertension before pregnancy and it continues during pregnancy. °¨ You develop hypertension before you are [redacted] weeks pregnant, and it continues during pregnancy. °· Gestational hypertension. This is hypertension that develops after the 20th week of pregnancy. °· Preeclampsia, also called toxemia of pregnancy. This is a very serious type of hypertension that develops only during pregnancy. It affects the whole body, and it can be very dangerous for you and your baby. °Gestational hypertension and preeclampsia usually go away within 6 weeks after your baby is born. Women who have hypertension during pregnancy have a greater chance of developing hypertension later in life or during future pregnancies. °What are the causes? °The exact cause of hypertension is not known. °What increases the risk? °There are certain factors that make it more likely for you to develop hypertension during pregnancy. These include: °· Having hypertension during a previous pregnancy or prior to pregnancy. °· Being overweight. °· Being older than age 40. °· Being pregnant for the first time or being pregnant with more than one baby. °· Becoming pregnant using fertilization methods such as IVF (in vitro fertilization). °· Having diabetes, kidney problems, or systemic lupus erythematosus. °· Having a family history of hypertension. °What are the  signs or symptoms? °Chronic hypertension and gestational hypertension rarely cause symptoms. Preeclampsia causes symptoms, which may include: °· Increased protein in your urine. Your health care provider will check for this at every visit before you give birth (prenatal visit). °· Severe headaches. °· Sudden weight gain. °· Swelling of the hands, face, legs, and feet. °· Nausea and vomiting. °· Vision problems, such as blurred or double vision. °· Numbness in the face, arms, legs, and feet. °· Dizziness. °· Slurred speech. °· Sensitivity to bright lights. °· Abdominal pain. °· Convulsions. °How is this diagnosed? °You may be diagnosed with hypertension during a routine prenatal exam. At each prenatal visit, you may: °· Have a urine test to check for high amounts of protein in your urine. °· Have your blood pressure checked. A blood pressure reading is recorded as two numbers, such as "120 over 80" (or 120/80). The first ("top") number is called the systolic pressure. It is a measure of the pressure in your arteries when your heart beats. The second ("bottom") number is called the diastolic pressure. It is a measure of the pressure in your arteries as your heart relaxes between beats. Blood pressure is measured in a unit called mm Hg. A normal blood pressure reading is: °¨ Systolic: below 120. °¨ Diastolic: below 80. °The type of hypertension that you are diagnosed with depends on your test results and when your symptoms developed. °· Chronic hypertension is usually diagnosed before 20 weeks of pregnancy. °· Gestational hypertension is usually diagnosed after 20 weeks of pregnancy. °· Hypertension with high amounts of protein in the urine is diagnosed as preeclampsia. °· Blood pressure measurements that stay above 160 systolic, or above 110 diastolic, are signs of severe preeclampsia. °  How is this treated? °Treatment for hypertension during pregnancy varies depending on the type of hypertension you have and how  serious it is. °· If you take medicines called ACE inhibitors to treat chronic hypertension, you may need to switch medicines. ACE inhibitors should not be taken during pregnancy. °· If you have gestational hypertension, you may need to take blood pressure medicine. °· If you are at risk for preeclampsia, your health care provider may recommend that you take a low-dose aspirin every day to prevent high blood pressure during your pregnancy. °· If you have severe preeclampsia, you may need to be hospitalized so you and your baby can be monitored closely. You may also need to take medicine (magnesium sulfate) to prevent seizures and to lower blood pressure. This medicine may be given as an injection or through an IV tube. °· In some cases, if your condition gets worse, you may need to deliver your baby early. °Follow these instructions at home: °Eating and drinking °· Drink enough fluid to keep your urine clear or pale yellow. °· Eat a healthy diet that is low in salt (sodium). Do not add salt to your food. Check food labels to see how much sodium a food or beverage contains. °Lifestyle °· Do not use any products that contain nicotine or tobacco, such as cigarettes and e-cigarettes. If you need help quitting, ask your health care provider. °· Do not use alcohol. °· Avoid caffeine. °· Avoid stress as much as possible. Rest and get plenty of sleep. °General instructions °· Take over-the-counter and prescription medicines only as told by your health care provider. °· While lying down, lie on your left side. This keeps pressure off your baby. °· While sitting or lying down, raise (elevate) your feet. Try putting some pillows under your lower legs. °· Exercise regularly. Ask your health care provider what kinds of exercise are best for you. °· Keep all prenatal and follow-up visits as told by your health care provider. This is important. °Contact a health care provider if: °· You have symptoms that your health care provider  told you may require more treatment or monitoring, such as: °¨ Fever. °¨ Vomiting. °¨ Headache. °Get help right away if: °· You have severe abdominal pain or vomiting that does not get better with treatment. °· You suddenly develop swelling in your hands, ankles, or face. °· You gain 4 lbs (1.8 kg) or more in 1 week. °· You develop vaginal bleeding, or you have blood in your urine. °· You do not feel your baby moving as much as usual. °· You have blurred or double vision. °· You have muscle twitching or sudden tightening (spasms). °· You have shortness of breath. °· Your lips or fingernails turn blue. °This information is not intended to replace advice given to you by your health care provider. Make sure you discuss any questions you have with your health care provider. °Document Released: 07/15/2011 Document Revised: 05/16/2016 Document Reviewed: 04/11/2016 °Elsevier Interactive Patient Education © 2017 [employer] ° °

## 2017-02-16 NOTE — Progress Notes (Signed)
   PRENATAL VISIT NOTE  Subjective:  Meagan Campos is a 31 y.o. [phone] at [redacted]w[redacted]d being seen today for ongoing prenatal care.  She is currently monitored for the following issues for this high-risk pregnancy and has CIN I (cervical intraepithelial neoplasia I); Rh negative, antepartum; Headaches in pregnancy, antepartum; History of gestational hypertension; Pregnancy, supervision, high-risk; and Gestational hypertension, antepartum on her problem list.  Denies HA, vision changes or epigastric pain.  Contractions: Irritability. Vag. Bleeding: None.  Movement: Present. Denies leaking of fluid.   The following portions of the patient's history were reviewed and updated as appropriate: allergies, current medications, past family history, past medical history, past social history, past surgical history and problem list. Problem list updated.  Objective:   Vitals:   02/16/17 0954  BP: 121/77  Pulse: 95  Weight: 158 lb (71.7 kg)    Fetal Status: Fetal Heart Rate (bpm): 147 Fundal Height: 33 cm Movement: Present     General:  Alert, oriented and cooperative. Patient is in no acute distress.  Skin: Skin is warm and dry. No rash noted.   Cardiovascular: Normal heart rate noted  Respiratory: Normal respiratory effort, no problems with respiration noted  Abdomen: Soft, gravid, appropriate for gestational age. Pain/Pressure: Present     Pelvic:  Cervical exam deferred        Extremities: Normal range of motion.  Edema: None  Mental Status: Normal mood and affect. Normal behavior. Normal judgment and thought content.   NST reactive  Assessment and Plan:  Pregnancy: [id number] at [redacted]w[redacted]d  1. Supervision of high risk pregnancy in third trimester   2. Gestational hypertension, antepartum - GrowthUS and BPP  Preterm labor symptoms and general obstetric precautions including but not limited to vaginal bleeding, contractions, leaking of fluid and fetal movement were reviewed in detail with the  patient. Please refer to After Visit Summary for other counseling recommendations.  Return for Start twice weekly testing.   Dorathy Kinsman, CNM

## 2017-02-19 ENCOUNTER — Ambulatory Visit (INDEPENDENT_AMBULATORY_CARE_PROVIDER_SITE_OTHER): Payer: Medicaid Other | Admitting: *Deleted

## 2017-02-19 ENCOUNTER — Encounter: Payer: Self-pay | Admitting: *Deleted

## 2017-02-19 DIAGNOSIS — O133 Gestational [pregnancy-induced] hypertension without significant proteinuria, third trimester: Secondary | ICD-10-CM

## 2017-02-19 DIAGNOSIS — O139 Gestational [pregnancy-induced] hypertension without significant proteinuria, unspecified trimester: Secondary | ICD-10-CM

## 2017-02-23 ENCOUNTER — Ambulatory Visit (INDEPENDENT_AMBULATORY_CARE_PROVIDER_SITE_OTHER): Payer: Medicaid Other | Admitting: Obstetrics & Gynecology

## 2017-02-23 VITALS — BP 128/66 | HR 84 | Wt 159.0 lb

## 2017-02-23 DIAGNOSIS — O133 Gestational [pregnancy-induced] hypertension without significant proteinuria, third trimester: Secondary | ICD-10-CM

## 2017-02-23 NOTE — Progress Notes (Signed)
   PRENATAL VISIT NOTE  Subjective:  Meagan Campos is a 31 y.o. [phone] at [redacted]w[redacted]d being seen today for ongoing prenatal care.  She is currently monitored for the following issues for this high-risk pregnancy and has CIN I (cervical intraepithelial neoplasia I); Rh negative, antepartum; Headaches in pregnancy, antepartum; History of gestational hypertension; Pregnancy, supervision, high-risk; and Gestational hypertension, antepartum on her problem list.  Patient reports no complaints.  Contractions: Irregular. Vag. Bleeding: None.  Movement: Present. Denies leaking of fluid. No problems with HA, scotomata, RUQ pain.  The following portions of the patient's history were reviewed and updated as appropriate: allergies, current medications, past family history, past medical history, past social history, past surgical history and problem list. Problem list updated.  Objective:   Vitals:   02/23/17 1318  BP: 128/66  Pulse: 84  Weight: 159 lb (72.1 kg)    Fetal Status: Fetal Heart Rate (bpm): 152 Fundal Height: 35 cm Movement: Present     General:  Alert, oriented and cooperative. Patient is in no acute distress.  Skin: Skin is warm and dry. No rash noted.   Cardiovascular: Normal heart rate noted  Respiratory: Normal respiratory effort, no problems with respiration noted  Abdomen: Soft, gravid, appropriate for gestational age. Pain/Pressure: Present     Pelvic:  Cervical exam deferred        Extremities: Normal range of motion.  Edema: None  Mental Status: Normal mood and affect. Normal behavior. Normal judgment and thought content.   Assessment and Plan:  Pregnancy: [id number] at [redacted]w[redacted]d  1.  Gestational Hypertension--pt continues to have elevated BPs on home monitoring 140s/90s.  Continue 2x weekly testing.  Last grwoth at 53%.  Pt declines f/u growth [place] before induction.    2.  GBS next visit.  Preterm labor symptoms and general obstetric precautions including but not limited to  vaginal bleeding, contractions, leaking of fluid and fetal movement were reviewed in detail with the patient. Please refer to After Visit Summary for other counseling recommendations.  Return in about 1 week (around 03/02/2017).   Lesly Dukes, MD

## 2017-02-26 ENCOUNTER — Other Ambulatory Visit (HOSPITAL_COMMUNITY)
Admission: RE | Admit: 2017-02-26 | Discharge: 2017-02-26 | Disposition: A | Discharge status: Home / Self Care | Payer: Medicaid Other | Source: Ambulatory Visit | Attending: Obstetrics & Gynecology | Admitting: Obstetrics & Gynecology

## 2017-02-26 ENCOUNTER — Ambulatory Visit (HOSPITAL_COMMUNITY): Payer: Medicaid Other

## 2017-02-26 ENCOUNTER — Ambulatory Visit (INDEPENDENT_AMBULATORY_CARE_PROVIDER_SITE_OTHER): Payer: Medicaid Other | Admitting: *Deleted

## 2017-02-26 VITALS — BP 128/83 | HR 103 | Wt 159.0 lb

## 2017-02-26 DIAGNOSIS — O133 Gestational [pregnancy-induced] hypertension without significant proteinuria, third trimester: Secondary | ICD-10-CM | POA: Insufficient documentation

## 2017-02-26 LAB — OB RESULTS CONSOLE GBS: STREP GROUP B AG: NEGATIVE

## 2017-02-27 ENCOUNTER — Telehealth (HOSPITAL_COMMUNITY): Payer: Self-pay | Admitting: *Deleted

## 2017-02-27 LAB — URINE CYTOLOGY ANCILLARY ONLY
Chlamydia: NEGATIVE
Neisseria Gonorrhea: NEGATIVE

## 2017-02-27 NOTE — Telephone Encounter (Signed)
Preadmission screen  

## 2017-02-28 LAB — CULTURE, BETA STREP (GROUP B ONLY)

## 2017-03-02 ENCOUNTER — Ambulatory Visit (INDEPENDENT_AMBULATORY_CARE_PROVIDER_SITE_OTHER): Payer: Medicaid Other | Admitting: Obstetrics & Gynecology

## 2017-03-02 VITALS — BP 129/76 | HR 98 | Wt 162.0 lb

## 2017-03-02 DIAGNOSIS — O139 Gestational [pregnancy-induced] hypertension without significant proteinuria, unspecified trimester: Secondary | ICD-10-CM

## 2017-03-02 DIAGNOSIS — O133 Gestational [pregnancy-induced] hypertension without significant proteinuria, third trimester: Secondary | ICD-10-CM

## 2017-03-02 NOTE — Progress Notes (Signed)
   PRENATAL VISIT NOTE  Subjective:  Meagan Campos is a 31 y.o. [phone] at [redacted]w[redacted]d being seen today for ongoing prenatal care.  She is currently monitored for the following issues for this high-risk pregnancy and has CIN I (cervical intraepithelial neoplasia I); Rh negative, antepartum; Headaches in pregnancy, antepartum; History of gestational hypertension; Pregnancy, supervision, high-risk; and Gestational hypertension, antepartum on her problem list.  Patient reports no complaints.  Contractions: Irritability. Vag. Bleeding: None.  Movement: Present. Denies leaking of fluid.   The following portions of the patient's history were reviewed and updated as appropriate: allergies, current medications, past family history, past medical history, past social history, past surgical history and problem list. Problem list updated.  Objective:   Vitals:   03/02/17 1326  BP: 129/76  Pulse: 98  Weight: 162 lb (73.5 kg)    Fetal Status: Fetal Heart Rate (bpm): NST-R Fundal Height: 36 cm Movement: Present  Presentation: Vertex  General:  Alert, oriented and cooperative. Patient is in no acute distress.  Skin: Skin is warm and dry. No rash noted.   Cardiovascular: Normal heart rate noted  Respiratory: Normal respiratory effort, no problems with respiration noted  Abdomen: Soft, gravid, appropriate for gestational age. Pain/Pressure: Present     Pelvic:  Cervical exam performed Dilation: 3 Effacement (%): 50 Station: Ballotable  Extremities: Normal range of motion.  Edema: None  Mental Status: Normal mood and affect. Normal behavior. Normal judgment and thought content.   Assessment and Plan:  Pregnancy: [id number] at [redacted]w[redacted]d  1. Gestational hypertension, antepartum BP nml today.  Has elevated BPs at home (taken off Baby scripts).  Mild gestational hypertension.  Preterm labor symptoms and general obstetric precautions including but not limited to vaginal bleeding, contractions, leaking of fluid  and fetal movement were reviewed in detail with the patient. Please refer to After Visit Summary for other counseling recommendations.  No Follow-up on file.   Lesly Dukes, MD

## 2017-03-05 ENCOUNTER — Other Ambulatory Visit: Payer: Self-pay | Admitting: Obstetrics and Gynecology

## 2017-03-06 ENCOUNTER — Inpatient Hospital Stay (HOSPITAL_COMMUNITY): Payer: Medicaid Other | Admitting: Anesthesiology

## 2017-03-06 ENCOUNTER — Inpatient Hospital Stay (HOSPITAL_COMMUNITY)
Admission: RE | Admit: 2017-03-06 | Discharge: 2017-03-08 | DRG: 775 | Disposition: A | Discharge status: Home / Self Care | Payer: Medicaid Other | Source: Ambulatory Visit | Attending: Obstetrics and Gynecology | Admitting: Obstetrics and Gynecology

## 2017-03-06 ENCOUNTER — Encounter (HOSPITAL_COMMUNITY): Payer: Self-pay

## 2017-03-06 VITALS — BP 134/73 | HR 72 | Temp 98.2°F | Resp 18 | Ht 64.0 in | Wt 162.0 lb

## 2017-03-06 DIAGNOSIS — Z3A37 37 weeks gestation of pregnancy: Secondary | ICD-10-CM

## 2017-03-06 DIAGNOSIS — O134 Gestational [pregnancy-induced] hypertension without significant proteinuria, complicating childbirth: Principal | ICD-10-CM | POA: Diagnosis present

## 2017-03-06 DIAGNOSIS — O139 Gestational [pregnancy-induced] hypertension without significant proteinuria, unspecified trimester: Secondary | ICD-10-CM | POA: Diagnosis present

## 2017-03-06 DIAGNOSIS — Z87891 Personal history of nicotine dependence: Secondary | ICD-10-CM | POA: Diagnosis not present

## 2017-03-06 LAB — COMPREHENSIVE METABOLIC PANEL
ALT: 13 U/L — AB (ref 14–54)
ANION GAP: 10 (ref 5–15)
AST: 19 U/L (ref 15–41)
Albumin: 3 g/dL — ABNORMAL LOW (ref 3.5–5.0)
Alkaline Phosphatase: 99 U/L (ref 38–126)
BUN: 7 mg/dL (ref 6–20)
CHLORIDE: 103 mmol/L (ref 101–111)
CO2: 22 mmol/L (ref 22–32)
CREATININE: 0.51 mg/dL (ref 0.44–1.00)
Calcium: 8.8 mg/dL — ABNORMAL LOW (ref 8.9–10.3)
Glucose, Bld: 69 mg/dL (ref 65–99)
POTASSIUM: 3.9 mmol/L (ref 3.5–5.1)
SODIUM: 135 mmol/L (ref 135–145)
Total Bilirubin: 0.6 mg/dL (ref 0.3–1.2)
Total Protein: 6 g/dL — ABNORMAL LOW (ref 6.5–8.1)

## 2017-03-06 LAB — RPR: RPR: NONREACTIVE

## 2017-03-06 LAB — CBC
HCT: 30.5 % — ABNORMAL LOW (ref 36.0–46.0)
HEMOGLOBIN: 9.9 g/dL — AB (ref 12.0–15.0)
MCH: 25.4 pg — AB (ref 26.0–34.0)
MCHC: 32.5 g/dL (ref 30.0–36.0)
MCV: 78.2 fL (ref 78.0–100.0)
Platelets: 270 10*3/uL (ref 150–400)
RBC: 3.9 MIL/uL (ref 3.87–5.11)
RDW: 13.9 % (ref 11.5–15.5)
WBC: 6.7 10*3/uL (ref 4.0–10.5)

## 2017-03-06 LAB — PROTEIN / CREATININE RATIO, URINE: CREATININE, URINE: 41 mg/dL

## 2017-03-06 MED ORDER — TERBUTALINE SULFATE 1 MG/ML IJ SOLN
0.2500 mg | Freq: Once | INTRAMUSCULAR | Status: DC | PRN
Start: 2017-03-06 — End: 2017-03-06
  Filled 2017-03-06: qty 1

## 2017-03-06 MED ORDER — DIPHENHYDRAMINE HCL 50 MG/ML IJ SOLN: 12.5000 mg | INTRAMUSCULAR | Status: DC | PRN

## 2017-03-06 MED ORDER — PHENYLEPHRINE 40 MCG/ML (10ML) SYRINGE FOR IV PUSH (FOR BLOOD PRESSURE SUPPORT)
80.0000 ug | PREFILLED_SYRINGE | INTRAVENOUS | Status: DC | PRN
  Administered 2017-03-06: 80 ug via INTRAVENOUS
  Filled 2017-03-06: qty 5

## 2017-03-06 MED ORDER — OXYCODONE-ACETAMINOPHEN 5-325 MG PO TABS: 2.0000 | ORAL_TABLET | ORAL | Status: DC | PRN

## 2017-03-06 MED ORDER — OXYCODONE-ACETAMINOPHEN 5-325 MG PO TABS: 1.0000 | ORAL_TABLET | ORAL | Status: DC | PRN

## 2017-03-06 MED ORDER — EPHEDRINE 5 MG/ML INJ
10.0000 mg | INTRAVENOUS | Status: DC | PRN
Start: 2017-03-06 — End: 2017-03-06
  Filled 2017-03-06: qty 2

## 2017-03-06 MED ORDER — ONDANSETRON HCL 4 MG/2ML IJ SOLN: 4.0000 mg | Freq: Four times a day (QID) | INTRAMUSCULAR | Status: DC | PRN

## 2017-03-06 MED ORDER — SOD CITRATE-CITRIC ACID 500-334 MG/5ML PO SOLN: 30.0000 mL | ORAL | Status: DC | PRN

## 2017-03-06 MED ORDER — IBUPROFEN 600 MG PO TABS
600.0000 mg | ORAL_TABLET | Freq: Four times a day (QID) | ORAL | Status: DC
  Administered 2017-03-06 – 2017-03-08 (×8): 600 mg via ORAL
  Filled 2017-03-06 (×7): qty 1

## 2017-03-06 MED ORDER — WITCH HAZEL-GLYCERIN EX PADS: 1.0000 "application " | MEDICATED_PAD | CUTANEOUS | Status: DC | PRN

## 2017-03-06 MED ORDER — OXYTOCIN BOLUS FROM INFUSION
500.0000 mL | Freq: Once | INTRAVENOUS | Status: AC
  Administered 2017-03-06: 500 mL via INTRAVENOUS

## 2017-03-06 MED ORDER — SIMETHICONE 80 MG PO CHEW: 80.0000 mg | CHEWABLE_TABLET | ORAL | Status: DC | PRN

## 2017-03-06 MED ORDER — ZOLPIDEM TARTRATE 5 MG PO TABS: 5.0000 mg | ORAL_TABLET | Freq: Every evening | ORAL | Status: DC | PRN

## 2017-03-06 MED ORDER — ONDANSETRON HCL 4 MG PO TABS: 4.0000 mg | ORAL_TABLET | ORAL | Status: DC | PRN

## 2017-03-06 MED ORDER — EPHEDRINE 5 MG/ML INJ: 10.0000 mg | INTRAVENOUS | Status: DC | PRN

## 2017-03-06 MED ORDER — EPHEDRINE 5 MG/ML INJ
10.0000 mg | INTRAVENOUS | Status: DC | PRN
  Filled 2017-03-06: qty 2

## 2017-03-06 MED ORDER — LACTATED RINGERS IV SOLN: 500.0000 mL | Freq: Once | INTRAVENOUS | Status: DC

## 2017-03-06 MED ORDER — FENTANYL 2.5 MCG/ML BUPIVACAINE 1/10 % EPIDURAL INFUSION (WH - ANES)
14.0000 mL/h | INTRAMUSCULAR | Status: DC | PRN
  Administered 2017-03-06: 14 mL/h via EPIDURAL
  Filled 2017-03-06: qty 100

## 2017-03-06 MED ORDER — ACETAMINOPHEN 325 MG PO TABS
650.0000 mg | ORAL_TABLET | ORAL | Status: DC | PRN
  Administered 2017-03-07: 650 mg via ORAL
  Filled 2017-03-06: qty 2

## 2017-03-06 MED ORDER — BENZOCAINE-MENTHOL 20-0.5 % EX AERO: 1.0000 "application " | INHALATION_SPRAY | CUTANEOUS | Status: DC | PRN

## 2017-03-06 MED ORDER — PRENATAL MULTIVITAMIN CH
1.0000 | ORAL_TABLET | Freq: Every day | ORAL | Status: DC
  Administered 2017-03-07 – 2017-03-08 (×2): 1 via ORAL
  Filled 2017-03-06 (×2): qty 1

## 2017-03-06 MED ORDER — OXYTOCIN 40 UNITS IN LACTATED RINGERS INFUSION - SIMPLE MED
2.5000 [IU]/h | INTRAVENOUS | Status: DC
  Filled 2017-03-06: qty 1000

## 2017-03-06 MED ORDER — DIPHENHYDRAMINE HCL 25 MG PO CAPS: 25.0000 mg | ORAL_CAPSULE | Freq: Four times a day (QID) | ORAL | Status: DC | PRN

## 2017-03-06 MED ORDER — LACTATED RINGERS IV SOLN
500.0000 mL | INTRAVENOUS | Status: DC | PRN
  Administered 2017-03-06 (×2): 500 mL via INTRAVENOUS

## 2017-03-06 MED ORDER — DIBUCAINE 1 % RE OINT: 1.0000 "application " | TOPICAL_OINTMENT | RECTAL | Status: DC | PRN

## 2017-03-06 MED ORDER — ONDANSETRON HCL 4 MG/2ML IJ SOLN: 4.0000 mg | INTRAMUSCULAR | Status: DC | PRN

## 2017-03-06 MED ORDER — ACETAMINOPHEN 325 MG PO TABS: 650.0000 mg | ORAL_TABLET | ORAL | Status: DC | PRN

## 2017-03-06 MED ORDER — PHENYLEPHRINE 40 MCG/ML (10ML) SYRINGE FOR IV PUSH (FOR BLOOD PRESSURE SUPPORT)
80.0000 ug | PREFILLED_SYRINGE | INTRAVENOUS | Status: DC | PRN
  Administered 2017-03-06: 80 ug via INTRAVENOUS
  Filled 2017-03-06: qty 10
  Filled 2017-03-06: qty 5

## 2017-03-06 MED ORDER — SENNOSIDES-DOCUSATE SODIUM 8.6-50 MG PO TABS
2.0000 | ORAL_TABLET | ORAL | Status: DC
  Administered 2017-03-06 – 2017-03-07 (×2): 2 via ORAL
  Filled 2017-03-06: qty 2

## 2017-03-06 MED ORDER — OXYTOCIN 40 UNITS IN LACTATED RINGERS INFUSION - SIMPLE MED
1.0000 m[IU]/min | INTRAVENOUS | Status: DC
  Administered 2017-03-06: 2 m[IU]/min via INTRAVENOUS

## 2017-03-06 MED ORDER — LIDOCAINE HCL (PF) 1 % IJ SOLN
30.0000 mL | INTRAMUSCULAR | Status: DC | PRN
  Filled 2017-03-06: qty 30

## 2017-03-06 MED ORDER — PHENYLEPHRINE 40 MCG/ML (10ML) SYRINGE FOR IV PUSH (FOR BLOOD PRESSURE SUPPORT): 80.0000 ug | PREFILLED_SYRINGE | INTRAVENOUS | Status: DC | PRN

## 2017-03-06 MED ORDER — FLEET ENEMA 7-19 GM/118ML RE ENEM: 1.0000 | ENEMA | RECTAL | Status: DC | PRN

## 2017-03-06 MED ORDER — LACTATED RINGERS IV SOLN
INTRAVENOUS | Status: DC
  Administered 2017-03-06 (×3): 125 mL/h via INTRAVENOUS

## 2017-03-06 MED ORDER — COCONUT OIL OIL: 1.0000 "application " | TOPICAL_OIL | Status: DC | PRN

## 2017-03-06 MED ORDER — LIDOCAINE HCL (PF) 1 % IJ SOLN
INTRAMUSCULAR | Status: DC | PRN
  Administered 2017-03-06: 4 mL via EPIDURAL

## 2017-03-06 NOTE — Anesthesia Pain Management Evaluation Note (Signed)
  CRNA Pain Management Visit Note  Patient: Meagan Campos, 31 y.o., female  "Hello I am a member of the anesthesia team at [facility]. We have an anesthesia team available at all times to provide care throughout the hospital, including epidural management and anesthesia for C-section. I don't know your plan for the delivery whether it a natural birth, water birth, IV sedation, nitrous supplementation, doula or epidural, but we want to meet your pain goals."   1.Was your pain managed to your expectations on prior hospitalizations?   Yes   2.What is your expectation for pain management during this hospitalization?     Epidural  3.How can we help you reach that goal? unsure  Record the patient's initial score and the patient's pain goal.   Pain: 0  Pain Goal: 6 [facility] wants you to be able to say your pain was always managed very well.  Cephus Shelling 03/06/2017

## 2017-03-06 NOTE — Lactation Note (Signed)
This note was copied from a baby's chart. Lactation Consultation Note  Patient Name: Meagan Campos [id number] Date: 03/06/2017 Reason for consult: Initial assessment;Other (Comment) (Early Term)   Initial consult with Exp BF mom of < 1 hour old infant. Mom holding infant and infant cueing to feed. Mom reports her 31 yo was born at 37 weeks and had no difficulty with BF.   Mom latched infant STS independently. Enc mom to massage/compress breast with feeding. Mom with soft compressible breasts with everted nipples. Mom reports she is able to hand express. Infant still feeding when LC left room  Enc mom to feed infant STS 8-12 x in 24 hours at first feeding cues. Enc mom to awaken infant as needed if not awakening to feed. Discussed that infant early term and may get sleepy at some point, enc mom to spoon feed after BF and before BF if infant sleepy.   Mom without questions/concerns. BF Resources Handout and LC Brochure given, mom informed of IP/OP services, BF Support Groups and LC phone #. Enc mom to call out for assistance as needed.    Maternal Data Formula Feeding for Exclusion: No Has patient been taught Hand Expression?: Yes Does the patient have breastfeeding experience prior to this delivery?: Yes  Feeding Feeding Type: Breast Fed Length of feed: 10 min (still feeding when LC left room)  LATCH Score/Interventions Latch: Grasps breast easily, tongue down, lips flanged, rhythmical sucking.  Audible Swallowing: A few with stimulation Intervention(s): Skin to skin;Hand expression;Alternate breast massage  Type of Nipple: Everted at rest and after stimulation  Comfort (Breast/Nipple): Soft / non-tender     Hold (Positioning): No assistance needed to correctly position infant at breast.  LATCH Score: 9  Lactation Tools Discussed/Used WIC Program: No   Consult Status Consult Status: Follow-up Date: 03/07/17 Follow-up type: In-patient    Meagan Campos 03/06/2017,  6:19 PM

## 2017-03-06 NOTE — Progress Notes (Signed)
Labor Progress Note  S: Patient resting comfortably. Discussed AROM and she is agreeable  O:  BP 127/81 (BP Location: Left Arm)   Pulse 98   Temp 98.9 F (37.2 C) (Oral)   Resp 16   Ht  (1.626 m)   Wt 73.5 kg (162 lb)   LMP 06/06/2016   BMI 27.81 kg/m  EFM: 150, moderate variability, +accels, no decels [id number]: 4 Effacement (%): 70 Cervical Position: Posterior Station: -3 Presentation: Vertex Exam by:: MD Aurie   A&P: 31 y.o. [id number] [redacted]w[redacted]d IOL for gHTN #Labor: AROM, continue pitocin, requesting epidural #FWB: Category 1 tracing #GBS: negative # gHTN: preE labs negative, BPs wnl ranges presently 120s/80s  Durenda Hurt, MD Resident Physician 2:01 PM

## 2017-03-06 NOTE — Anesthesia Procedure Notes (Signed)
Epidural Patient location during procedure: OB Start time: 03/06/2017 2:19 PM End time: 03/06/2017 2:25 PM  Staffing Anesthesiologist: Shona Simpson D Performed: anesthesiologist   Preanesthetic Checklist Completed: patient identified, site marked, surgical consent, pre-op evaluation, timeout performed, IV checked, risks and benefits discussed and monitors and equipment checked  Epidural Patient position: sitting Prep: ChloraPrep Patient monitoring: heart rate, continuous pulse ox and blood pressure Approach: midline Location: L3-L4 Injection technique: LOR saline  Needle:  Needle type: Tuohy  Needle gauge: 17 G Needle length: 9 cm Catheter type: closed end flexible Catheter size: 20 Guage Test dose: negative and 1.5% lidocaine  Assessment Events: blood not aspirated, injection not painful, no injection resistance and no paresthesia  Additional Notes LOR @ 4.5  Patient identified. Risks/Benefits/Options discussed with patient including but not limited to bleeding, infection, nerve damage, paralysis, failed block, incomplete pain control, headache, blood pressure changes, nausea, vomiting, reactions to medications, itching and postpartum back pain. Confirmed with bedside nurse the patient's most recent platelet count. Confirmed with patient that they are not currently taking any anticoagulation, have any bleeding history or any family history of bleeding disorders. Patient expressed understanding and wished to proceed. All questions were answered. Sterile technique was used throughout the entire procedure. Please see nursing notes for vital signs. Test dose was given through epidural catheter and negative prior to continuing to dose epidural or start infusion. Warning signs of high block given to the patient including shortness of breath, tingling/numbness in hands, complete motor block, or any concerning symptoms with instructions to call for help. Patient was given instructions on  fall risk and not to get out of bed. All questions and concerns addressed with instructions to call with any issues or inadequate analgesia.    Reason for block:procedure for pain

## 2017-03-06 NOTE — H&P (Signed)
Meagan Campos is a 31 y.o. female [phone] at 84 weeks presenting for IOL for gestational hypertension.  OB History    Gravida Para Term Preterm AB Living   SAB TAB Ectopic Multiple Live Births           2     Past Medical History:  Diagnosis Date  . Abnormal Pap smear   . Anemia   . Migraine   . Supervision of normal pregnancy 07/01/2012   Meagan Campos Genetic Screen First Screen ordered (NT normal, first screen normal) MSAFP normal  Anatomic [place]   Glucose Screen   GBS   Feeding Preference Breast Feeding  Contraception   Circumcision        Past Surgical History:  Procedure Laterality Date  . TONSILLECTOMY  age 33   Family History: family history includes Cancer in her maternal grandmother, paternal grandfather, and paternal grandmother. Social History:  reports that she quit smoking about 7 years ago. She has never used smokeless tobacco. She reports that she does not drink alcohol or use drugs.     Maternal Diabetes: No Genetic Screening: Normal Maternal Ultrasounds/Referrals: Normal Fetal Ultrasounds or other Referrals:  None Maternal Substance Abuse:  No Significant Maternal Medications:  None Significant Maternal Lab Results:  None Other Comments:  None  Review of Systems  Constitutional: Negative.   HENT: Negative.   Eyes: Negative.   Respiratory: Negative.   Cardiovascular: Negative.   Gastrointestinal: Negative.   Genitourinary: Negative.   Musculoskeletal: Negative.   Skin: Negative.   Neurological: Negative.   Endo/Heme/Allergies: Negative.   Psychiatric/Behavioral: Negative.    Maternal Medical History:  Reason for admission: IOL for gestational hypertension   Contractions: Frequency: rare.   Perceived severity is mild.    Fetal activity: Perceived fetal activity is normal.   Last perceived fetal movement was within the past hour.    Prenatal complications: PIH.   Prenatal Complications - Diabetes: none.      Blood pressure  131/86, pulse 91, temperature 98.9 F (37.2 C), temperature source Oral, resp. rate 16, height  (1.626 m), weight 162 lb (73.5 kg), last menstrual period 06/06/2016, currently breastfeeding. Maternal Exam:  Uterine Assessment: Contraction strength is mild.  Contraction frequency is rare.   Abdomen: Patient reports no abdominal tenderness. Estimated fetal weight is 7lbs.   Fetal presentation: vertex  Introitus: Normal vulva. Normal vagina.  Ferning test: not done.  Nitrazine test: not done. Amniotic fluid character: not assessed.  Pelvis: adequate for delivery.      Fetal Exam Fetal Monitor Review: Mode: ultrasound.   Baseline rate: 140.  Variability: moderate (6-25 bpm).   Pattern: accelerations present and no decelerations.    Fetal State Assessment: Category I - tracings are normal.     Physical Exam  Constitutional: She is oriented to person, place, and time. She appears well-developed and well-nourished.  HENT:  Head: Normocephalic.  Eyes: Pupils are equal, round, and reactive to light.  Neck: Normal range of motion.  Cardiovascular: Normal rate, regular rhythm and normal heart sounds.   Respiratory: Effort normal.  GI: Soft.  Musculoskeletal: Normal range of motion.  Neurological: She is alert and oriented to person, place, and time.  Skin: Skin is warm and dry.  Psychiatric: She has a normal mood and affect. Her behavior is normal. Judgment and thought content normal.    Prenatal labs: ABO, Rh: A/NEG/-- (10/18 1556) Antibody: NEG (02/19 0929) Rubella: 1.19 (10/18 1556)  RPR: NON REAC (02/19 0820)  HBsAg: NEGATIVE (10/18 1556)  HIV: NONREACTIVE (02/19 0820)  GBS:     Assessment/Plan: IUP at term. IOL for gestational hypertension. GBS neg.  Plan: pitocin for induction of labor. Pre-eclampsia labs.    Meagan Campos 03/06/2017, 8:03 AM   I have seen and examined this patient and agree with the management plan.

## 2017-03-06 NOTE — Progress Notes (Signed)
Patient ID: Meagan Campos, female   DOB: 1986/01/20, 31 y.o.   MRN: [id number] [facility] Attending  Presentation verified as vertex by U/S. Will proceed with IOL d/t GHTN

## 2017-03-06 NOTE — Anesthesia Preprocedure Evaluation (Signed)
Anesthesia Evaluation  Patient identified by MRN, date of birth, ID band Patient awake    Reviewed: Allergy & Precautions, Patient's Chart, lab work & pertinent test results  Airway Mallampati: I       Dental  (+) Teeth Intact   Pulmonary former smoker,    breath sounds clear to auscultation       Cardiovascular hypertension,  Rhythm:Regular Rate:Normal     Neuro/Psych  Headaches, negative psych ROS   GI/Hepatic negative GI ROS, Neg liver ROS,   Endo/Other  negative endocrine ROS  Renal/GU negative Renal ROS  negative genitourinary   Musculoskeletal negative musculoskeletal ROS (+)   Abdominal   Peds negative pediatric ROS (+)  Hematology negative hematology ROS (+)   Anesthesia Other Findings   Reproductive/Obstetrics (+) Pregnancy                             Lab Results  Component Value Date   WBC 6.7 03/06/2017   HGB 9.9 (L) 03/06/2017   HCT 30.5 (L) 03/06/2017   MCV 78.2 03/06/2017   PLT 270 03/06/2017     Anesthesia Physical Anesthesia Plan  ASA: II  Anesthesia Plan: Epidural   Post-op Pain Management:    Induction:   Airway Management Planned:   Additional Equipment:   Intra-op Plan:   Post-operative Plan:   Informed Consent: I have reviewed the patients History and Physical, chart, labs and discussed the procedure including the risks, benefits and alternatives for the proposed anesthesia with the patient or authorized representative who has indicated his/her understanding and acceptance.     Plan Discussed with:   Anesthesia Plan Comments:         Anesthesia Quick Evaluation

## 2017-03-07 LAB — TYPE AND SCREEN
ABO/RH(D): A NEG
Antibody Screen: POSITIVE
DAT, IgG: NEGATIVE
UNIT DIVISION: 0
Unit division: 0

## 2017-03-07 LAB — BPAM RBC
BLOOD PRODUCT EXPIRATION DATE: 201805162359
Blood Product Expiration Date: 201805162359
UNIT TYPE AND RH: 9500
Unit Type and Rh: 9500

## 2017-03-07 MED ORDER — DOCUSATE SODIUM 100 MG PO CAPS: 100.0000 mg | ORAL_CAPSULE | Freq: Two times a day (BID) | ORAL | 0 refills | Status: DC

## 2017-03-07 MED ORDER — RHO D IMMUNE GLOBULIN 1500 UNIT/2ML IJ SOSY
300.0000 ug | PREFILLED_SYRINGE | Freq: Once | INTRAMUSCULAR | Status: AC
  Administered 2017-03-07: 300 ug via INTRAVENOUS
  Filled 2017-03-07: qty 2

## 2017-03-07 MED ORDER — IBUPROFEN 600 MG PO TABS: 600.0000 mg | ORAL_TABLET | Freq: Four times a day (QID) | ORAL | 0 refills | Status: DC

## 2017-03-07 NOTE — Discharge Instructions (Signed)

## 2017-03-07 NOTE — Lactation Note (Signed)
This note was copied from a baby's chart. Lactation Consultation Note  Patient Name: Meagan Campos [id number] Date: 03/07/2017 Reason for consult: Follow-up assessment   Follow up with mom of 24 hour old Early Term infant. Mom reports BF is going well. She denies nipple pain/tenderness. Infant with 7 BF for 10 minutes, 4 attempts, 9 voids and 2 stools since birth. Infant weight 6 lb 15.6 oz with 3 % weight loss since birth. LATCH Scores 9-10.   Enc mom to try and get infant to feed for longer periods of time and for mom to massage/compress breast with feedings. Mom reports infant ate for longer for the last feeding and mom noted swallows with feeding. Infant noted to have dried milk around her mouth. She is currently asleep in her crib.   Mom without questions/concerns. Mom reports she is having a lot of uterine cramping with feedings. Enc mom to call out for assistance as needed.    Maternal Data Formula Feeding for Exclusion: No Has patient been taught Hand Expression?: Yes Does the patient have breastfeeding experience prior to this delivery?: Yes  Feeding Feeding Type: Breast Fed Length of feed: 15 min  LATCH Score/Interventions Latch:  (asked mom to when baby is latched)                    Lactation Tools Discussed/Used     Consult Status Consult Status: Follow-up Date: 03/08/17 Follow-up type: In-patient    Meagan Campos 03/07/2017, 6:11 PM

## 2017-03-07 NOTE — Anesthesia Postprocedure Evaluation (Signed)
Anesthesia Post Note  Patient: Meagan Campos  Procedure(s) Performed: * No procedures listed *  Patient location during evaluation: Mother Baby Anesthesia Type: Epidural Level of consciousness: awake Pain management: satisfactory to patient Vital Signs Assessment: post-procedure vital signs reviewed and stable Respiratory status: spontaneous breathing Cardiovascular status: stable Anesthetic complications: no        Last Vitals:  Vitals:   03/07/17 0028 03/07/17 0633  BP: 130/66 130/74  Pulse: 80 87  Resp: 18 20  Temp: 36.6 C 36.7 C    Last Pain:  Vitals:   03/07/17 0633  TempSrc: Oral  PainSc:    Pain Goal:                 Cephus Shelling

## 2017-03-07 NOTE — Discharge Summary (Signed)
OB Discharge Summary     Patient Name: Meagan Campos DOB: [birth year] MRN: [id number]  Date of admission: 03/06/2017 Delivering MD: Durenda Hurt   Date of discharge: 03/07/2017  Admitting diagnosis: INDUCTION Intrauterine pregnancy: [redacted]w[redacted]d     Secondary diagnosis:  Active Problems:   Gestational hypertension  Additional problems: None     Discharge diagnosis: Term Pregnancy Delivered                                                                                                Post partum procedures:N/A  Augmentation: Pitocin  Complications: None  Hospital course:  Onset of Labor With Vaginal Delivery     31 y.o. yo [phone] at [redacted]w[redacted]d was admitted in for induction of labor for [facility] 03/06/2017. She was augmented with AROM and pitocin. Patient had an uncomplicated labor course as follows:  Membrane Rupture Time/Date: 1:50 PM ,03/06/2017   Intrapartum Procedures: Episiotomy: None [1]                                         Lacerations:  None [1]  Patient had a delivery of a Viable infant. 03/06/2017  Information for the patient's newborn:  Meagan, Faivre Girl Campos [[id number]]  Delivery Method: Vaginal, Spontaneous Delivery (Filed from Delivery Summary)    Pateint had an uncomplicated postpartum course.  She is ambulating, tolerating a regular diet, passing flatus, and urinating well. Patient is discharged home in stable condition on 03/07/17.   Physical exam  Vitals:   03/06/17 2049 03/07/17 0028 03/07/17 0633 03/07/17 0853  BP: 140/70 130/66 130/74 139/86  Pulse: 85 80 87 98  Resp: Temp: 97.8 F (36.6 C) 97.9 F (36.6 C) 98.1 F (36.7 C) 98 F (36.7 C)  TempSrc: Oral Oral Oral   SpO2: 99% 100%    Weight:      Height:       General: alert, cooperative and no distress Lochia: appropriate Uterine Fundus: firm Incision: N/A DVT Evaluation: No evidence of DVT seen on physical exam. Labs: Lab Results  Component Value Date   WBC 6.7 03/06/2017   HGB  9.9 (L) 03/06/2017   HCT 30.5 (L) 03/06/2017   MCV 78.2 03/06/2017   PLT 270 03/06/2017   CMP Latest Ref Rng & Units 03/06/2017  Glucose 65 - 99 mg/dL 69  BUN 6 - 20 mg/dL 7  Creatinine 6.96 - 2.95 mg/dL 2.84  Sodium 132 - 440 mmol/L 135  Potassium 3.5 - 5.1 mmol/L 3.9  Chloride 101 - 111 mmol/L 103  CO2 22 - 32 mmol/L 22  Calcium 8.9 - 10.3 mg/dL 1.0(U)  Total Protein 6.5 - 8.1 g/dL 6.0(L)  Total Bilirubin 0.3 - 1.2 mg/dL 0.6  Alkaline Phos 38 - 126 U/L 99  AST 15 - 41 U/L 19  ALT 14 - 54 U/L 13(L)    Discharge instruction: per After Visit Summary and "Baby and Me Booklet".  After visit meds:  Allergies as of 03/07/2017  No Known Allergies     Medication List    STOP taking these medications   aspirin 81 MG chewable tablet Commonly known as:  ASPIRIN CHILDRENS     TAKE these medications   docusate sodium 100 MG capsule Commonly known as:  COLACE Take 1 capsule (100 mg total) by mouth 2 (two) times daily.   ibuprofen 600 MG tablet Commonly known as:  ADVIL,MOTRIN Take 1 tablet (600 mg total) by mouth every 6 (six) hours.   loratadine 10 MG tablet Commonly known as:  CLARITIN Take 10 mg by mouth daily as needed for allergies.       Diet: routine diet  Activity: Advance as tolerated. Pelvic rest for 6 weeks.   Outpatient follow up:6 weeks Follow up Appt:No future appointments. Follow up Visit:No Follow-up on file.  Postpartum contraception: IUD Paragard  Newborn Data: Live born female  Birth Weight: 7 lb 2.6 oz (3250 g) APGAR: 8, 9  Baby Feeding: Breast Disposition:home with mother   03/07/2017 [name], MD   OB FELLOW DISCHARGE ATTESTATION  I confirm that I have verified the information documented in the resident's note and that I have also personally reperformed the physical exam and all medical decision making activities.     Ernestina Penna, MD 10:02 AM

## 2017-03-08 LAB — RH IG WORKUP (INCLUDES ABO/RH)
ABO/RH(D): A NEG
FETAL SCREEN: NEGATIVE
Gestational Age(Wks): 37
Unit division: 0

## 2017-03-08 MED ORDER — ACETAMINOPHEN 325 MG PO TABS: 650.0000 mg | ORAL_TABLET | ORAL | 0 refills | Status: DC | PRN

## 2017-03-08 NOTE — Discharge Summary (Signed)
OB Discharge Summary     Patient Name: Meagan Campos DOB: 03/17/1986 MRN: [id number]  Date of admission: 03/06/2017 Delivering MD: Durenda Hurt   Date of discharge: 03/08/2017  Admitting diagnosis: INDUCTION Intrauterine pregnancy: [redacted]w[redacted]d     Secondary diagnosis:  Active Problems:   Gestational hypertension   Additional problems: none     Discharge diagnosis: Term Pregnancy Delivered and Gestational Hypertension                                                                                                Post partum procedures:none  Augmentation: AROM and Pitocin  Complications: None  Hospital course:  Induction of Labor With Vaginal Delivery   31 y.o. yo [phone] at [redacted]w[redacted]d was admitted to the hospital 03/06/2017 for induction of labor.  Indication for induction: Gestational hypertension.  Patient had an uncomplicated labor course as follows: Membrane Rupture Time/Date: 1:50 PM ,03/06/2017   Intrapartum Procedures: Episiotomy: None [1]                                         Lacerations:  None [1]  Patient had delivery of a Viable infant.  Information for the patient's newborn:  Meagan, Griffitts Girl Campos [[id number]]  Delivery Method: Vaginal, Spontaneous Delivery (Filed from Delivery Summary)   03/06/2017  Details of delivery can be found in separate delivery note.  Patient had a routine postpartum course. Patient is discharged home 03/08/17.  Physical exam  Vitals:   03/07/17 0633 03/07/17 0853 03/07/17 1727 03/08/17 0500  BP: 130/74 139/86 130/82 134/73  Pulse: 87 98 75 72  Resp: Temp: 98.1 F (36.7 C) 98 F (36.7 C) 98.1 F (36.7 C) 98.2 F (36.8 C)  TempSrc: Oral  Axillary Oral  SpO2:      Weight:      Height:       General: alert, cooperative and no distress Lochia: appropriate Uterine Fundus: firm Incision: N/A DVT Evaluation: No evidence of DVT seen on physical exam. Labs: Lab Results  Component Value Date   WBC 6.7 03/06/2017   HGB 9.9  (L) 03/06/2017   HCT 30.5 (L) 03/06/2017   MCV 78.2 03/06/2017   PLT 270 03/06/2017   CMP Latest Ref Rng & Units 03/06/2017  Glucose 65 - 99 mg/dL 69  BUN 6 - 20 mg/dL 7  Creatinine 6.96 - 2.95 mg/dL 2.84  Sodium 132 - 440 mmol/L 135  Potassium 3.5 - 5.1 mmol/L 3.9  Chloride 101 - 111 mmol/L 103  CO2 22 - 32 mmol/L 22  Calcium 8.9 - 10.3 mg/dL 1.0(U)  Total Protein 6.5 - 8.1 g/dL 6.0(L)  Total Bilirubin 0.3 - 1.2 mg/dL 0.6  Alkaline Phos 38 - 126 U/L 99  AST 15 - 41 U/L 19  ALT 14 - 54 U/L 13(L)    Discharge instruction: per After Visit Summary and "Baby and Me Booklet".  After visit meds:  Allergies as of 03/08/2017   No Known Allergies  Medication List    STOP taking these medications   aspirin 81 MG chewable tablet Commonly known as:  ASPIRIN CHILDRENS     TAKE these medications   acetaminophen 325 MG tablet Commonly known as:  TYLENOL Take 2 tablets (650 mg total) by mouth every 4 (four) hours as needed (for pain scale < 4).   docusate sodium 100 MG capsule Commonly known as:  COLACE Take 1 capsule (100 mg total) by mouth 2 (two) times daily.   ibuprofen 600 MG tablet Commonly known as:  ADVIL,MOTRIN Take 1 tablet (600 mg total) by mouth every 6 (six) hours.   loratadine 10 MG tablet Commonly known as:  CLARITIN Take 10 mg by mouth daily as needed for allergies.       Diet: routine diet  Activity: Advance as tolerated. Pelvic rest for 6 weeks.   Outpatient follow up:6 weeks Follow up Appt:No future appointments. Follow up Visit:No Follow-up on file.  Postpartum contraception: IUD Paragard  Newborn Data: Live born female  Birth Weight: 7 lb 2.6 oz (3250 g) APGAR: 8, 9  Baby Feeding: Breast Disposition:home with mother    03/08/2017 Ernestina Penna, MD

## 2017-03-08 NOTE — Lactation Note (Signed)
This note was copied from a baby's chart. Lactation Consultation Note; Experienced BF mom reports baby has been nursing well. Now under phototherapy. Last LS by RN 9. Reports breasts are feeling a little fuller today. Encouraged to page when baby wakes for next feeding. No questions at present   Patient Name: Meagan Campos [id number]'[id number] Date: 03/08/2017 Reason for consult: Follow-up assessment;Late preterm infant   Maternal Data Formula Feeding for Exclusion: No Does the patient have breastfeeding experience prior to this delivery?: Yes  Feeding Feeding Type: Breast Fed Length of feed: 8 min  LATCH Score/Interventions                      Lactation Tools Discussed/Used     Consult Status Consult Status: Follow-up Date: 03/09/17 Follow-up type: In-patient    Pamelia Hoit 03/08/2017, 1:25 PM

## 2017-03-09 ENCOUNTER — Ambulatory Visit: Payer: Self-pay

## 2017-03-09 NOTE — Lactation Note (Signed)
This note was copied from a baby's chart. Lactation Consultation Note   With this experienced breast feeding mom and early term baby, under phototherapy. Mom reports baby sleepy at breast, so she has begum pumping and supplementing with EBM. The baby did well with slow flow nipple, 35 mls taken. Review of early term behavior, o/p lactation available if mom hs issues getting baby back to breast feeding. Mom praised for beginning supplementation, and knows to call for questions, concerns. Baby presently at 9% weight loss. Adequate output.   Patient Name: Meagan Campos ZOXWR'U Date: 03/09/2017     Maternal Data    Feeding Feeding Type: Breast Fed Nipple Type: Slow - flow Length of feed: 10 min  LATCH Score/Interventions                      Lactation Tools Discussed/Used WIC Program: No Pump Review: Setup, frequency, and cleaning;Milk Storage Date initiated:: 03/09/17   Consult Status Consult Status: Follow-up Follow-up type: Call as needed    Alfred Levins 03/09/2017, 9:44 AM

## 2017-03-10 ENCOUNTER — Ambulatory Visit: Payer: Self-pay

## 2017-03-10 NOTE — Lactation Note (Signed)
This note was copied from a baby's chart. Lactation Consultation Note  Patient Name: Meagan Campos ZOXWR'[id number] Date: 03/10/2017 Reason for consult: Follow-up assessment    With this mom of an early term baby, now 18 hours old. She gained weight yesterday, with mom breast feeding and supplementing by bottle with EBM. The baby is taking as much as 45 ml's at a time, and mom is feeding her as least every 3 hours, more frequently with cues. Mom knows to call for lactation for any questions/concerns.   Maternal Data    Feeding    LATCH Score/Interventions                      Lactation Tools Discussed/Used     Consult Status Consult Status: Complete Follow-up type: Call as needed    Alfred Levins 03/10/2017, 9:21 AM

## 2017-04-24 NOTE — Progress Notes (Signed)
Post discharge chart review completed.  

## 2017-04-28 ENCOUNTER — Ambulatory Visit (INDEPENDENT_AMBULATORY_CARE_PROVIDER_SITE_OTHER): Payer: Medicaid Other | Admitting: Obstetrics & Gynecology

## 2017-04-28 ENCOUNTER — Encounter: Payer: Self-pay | Admitting: Obstetrics & Gynecology

## 2017-04-28 ENCOUNTER — Other Ambulatory Visit (HOSPITAL_COMMUNITY)
Admission: RE | Admit: 2017-04-28 | Discharge: 2017-04-28 | Disposition: A | Discharge status: Home / Self Care | Payer: Medicaid Other | Source: Ambulatory Visit | Attending: Obstetrics & Gynecology | Admitting: Obstetrics & Gynecology

## 2017-04-28 VITALS — BP 134/73 | HR 80 | Wt 148.4 lb

## 2017-04-28 DIAGNOSIS — Z30430 Encounter for insertion of intrauterine contraceptive device: Secondary | ICD-10-CM

## 2017-04-28 DIAGNOSIS — Z0000 Encounter for general adult medical examination without abnormal findings: Secondary | ICD-10-CM

## 2017-04-28 LAB — POCT URINE PREGNANCY: PREG TEST UR: NEGATIVE

## 2017-04-28 MED ORDER — PARAGARD INTRAUTERINE COPPER IU IUD
INTRAUTERINE_SYSTEM | Freq: Once | INTRAUTERINE | Status: AC
  Administered 2017-04-28: 16:00:00 via INTRAUTERINE

## 2017-04-28 NOTE — Progress Notes (Signed)
Subjective:     Meagan Campos is a 31 y.o. female who presents for a postpartum visit. She is 7 weeks postpartum following a spontaneous vaginal delivery. I have fully reviewed the prenatal and intrapartum course. The delivery was at 37 gestational weeks. Outcome: spontaneous vaginal delivery. Anesthesia: epidural. Postpartum course has been normal. Baby's course has been normal. Baby is feeding by breast. Bleeding no bleeding. Bowel function is normal. Bladder function is normal. Patient is sexually active. Contraception method is condoms. Postpartum depression screening: negative.  The following portions of the patient's history were reviewed and updated as appropriate: allergies, current medications, past family history, past medical history, past social history, past surgical history and problem list.  Review of Systems Pertinent items are noted in HPI.   Objective:    BP 134/73   Pulse 80   Wt 148 lb 6 oz (67.3 kg)   LMP 04/27/2017   Breastfeeding? Yes   BMI 25.47 kg/m   General:  alert   Breasts:  inspection negative, no nipple discharge or bleeding, no masses or nodularity palpable  Lungs: clear to auscultation bilaterally  Heart:  regular rate and rhythm, S1, S2 normal, no murmur, click, rub or gallop  Abdomen: soft, non-tender; bowel sounds normal; no masses,  no organomegaly   Vulva:  normal  Vagina: normal vagina  Cervix:  anteverted  Corpus: normal  Adnexa:  normal adnexa  Rectal Exam: Not performed.       UPT negative, consent signed, Time out procedure done. Cervix prepped with betadine and grasped with a single tooth tenaculum. Paragard was easily placed and the strings were cut to 3-4 cm. Uterus sounded to 9 cm. She tolerated the procedure well.   Assessment:    Normal postpartum exam. Pap smear done at today's visit.   Plan:    1. Contraception: IUD 2. Rec back up method for 2 weeks 3. Follow up in: 4 weeks or as needed.

## 2017-05-01 LAB — CYTOLOGY - PAP
DIAGNOSIS: UNDETERMINED — AB
HPV (WINDOPATH): NOT DETECTED

## 2017-05-11 ENCOUNTER — Telehealth: Payer: Self-pay

## 2017-05-11 NOTE — Telephone Encounter (Signed)
Left message on pt's phone advising her to return to the office for a repeat pap smear in one year per Dr.Dove.

## 2017-10-29 ENCOUNTER — Encounter: Payer: Self-pay | Admitting: Obstetrics & Gynecology

## 2017-10-29 ENCOUNTER — Ambulatory Visit: Payer: Self-pay | Admitting: Obstetrics & Gynecology

## 2017-10-29 VITALS — BP 150/98 | HR 99 | Wt 163.0 lb

## 2017-10-29 DIAGNOSIS — N938 Other specified abnormal uterine and vaginal bleeding: Secondary | ICD-10-CM

## 2017-10-29 NOTE — Progress Notes (Signed)
First BP was 150/98. Repeat BP was 151/94. Pt c/o bleeding and occasionally feeling light headed since she got the IUD.

## 2017-10-29 NOTE — Progress Notes (Signed)
Patient ID: Meagan PereyraBrooke L Campos, female   DOB: 04/21/1986, 31 y.o.   MRN: [id number]  Chief Complaint  Patient presents with  . IUD Check    HPI Meagan Campos is a 31 y.o. female. Married P3 here with 3 months of daily bleeding. She had a Paragard IUD placed at her postpartum visit 6/18. She had a heavy monthly period until bleeding daily for the last 3 months. She also feels lightheaded in the last few weeks. She had a Mirena in the past but had ovarian cysts and migraines worsen with the Mirena. HPI  Past Medical History:  Diagnosis Date  . Abnormal Pap smear   . Anemia   . Migraine   . Supervision of normal pregnancy 07/01/2012   [place] First Screen ordered (NT normal, first screen normal) MSAFP normal  Anatomic US   Glucose Screen   GBS   Feeding Preference Breast Feeding  Contraception   Circumcision         Past Surgical History:  Procedure Laterality Date  . TONSILLECTOMY  age 89    Family History  Problem Relation Age of Onset  . Cancer Maternal Grandmother        ovarian  . Cancer Paternal Grandmother        lung  . Cancer Paternal Grandfather        colon, prostate    Social History Social History   Tobacco Use  . Smoking status: Former Smoker    Last attempt to quit: 11/10/2009    Years since quitting: 7.9  . Smokeless tobacco: Never Used  Substance Use Topics  . Alcohol use: No    Alcohol/week: 0.0 oz    Comment: rare   . Drug use: No    No Known Allergies  Current Outpatient Medications  Medication Sig Dispense Refill  . acetaminophen (TYLENOL) 325 MG tablet Take 2 tablets (650 mg total) by mouth every 4 (four) hours as needed (for pain scale < 4). (Patient not taking: Reported on 04/28/2017) 30 tablet 0  . docusate sodium (COLACE) 100 MG capsule Take 1 capsule (100 mg total) by mouth 2 (two) times daily. (Patient not taking: Reported on 04/28/2017) 30 capsule 0  . ibuprofen (ADVIL,MOTRIN) 600 MG tablet Take 1 tablet (600 mg total) by  mouth every 6 (six) hours. (Patient not taking: Reported on 04/28/2017) 30 tablet 0  . loratadine (CLARITIN) 10 MG tablet Take 10 mg by mouth daily as needed for allergies.     No current facility-administered medications for this visit.     Review of Systems Review of Systems  Blood pressure (!) 150/98, pulse 99, weight 163 lb (73.9 kg), not currently breastfeeding.  Physical Exam Physical Exam Breathing, conversing, and ambulating normally Well nourished, well hydrated white female, no apparent distress Cervix- Paragard seens seen, dark red bloody mucous seen NSSA, NT, mobile, no adnexal masses Data Reviewed   Assessment    DUB    Plan    Check CBC, TSH, gyn u/s       Cyndi Montejano C Neva Ramaswamy 10/29/2017, 2:36 PM

## 2017-10-30 ENCOUNTER — Ambulatory Visit (INDEPENDENT_AMBULATORY_CARE_PROVIDER_SITE_OTHER): Payer: Self-pay

## 2017-10-30 DIAGNOSIS — Z7951 Long term (current) use of inhaled steroids: Secondary | ICD-10-CM

## 2017-10-30 DIAGNOSIS — N938 Other specified abnormal uterine and vaginal bleeding: Secondary | ICD-10-CM

## 2017-10-30 LAB — CBC
HCT: 36.6 % (ref 35.0–45.0)
Hemoglobin: 12.1 g/dL (ref 11.7–15.5)
MCH: 27.6 pg (ref 27.0–33.0)
MCHC: 33.1 g/dL (ref 32.0–36.0)
MCV: 83.6 fL (ref 80.0–100.0)
MPV: 10.4 fL (ref 7.5–12.5)
Platelets: 328 10*3/uL (ref 140–400)
RBC: 4.38 10*6/uL (ref 3.80–5.10)
RDW: 14.4 % (ref 11.0–15.0)
WBC: 6.4 10*3/uL (ref 3.8–10.8)

## 2017-10-30 LAB — TSH: TSH: 1.93 m[IU]/L

## 2017-11-06 ENCOUNTER — Encounter: Payer: Self-pay | Admitting: Obstetrics & Gynecology

## 2017-11-24 ENCOUNTER — Encounter: Payer: Self-pay | Admitting: Obstetrics and Gynecology

## 2017-11-24 ENCOUNTER — Ambulatory Visit (INDEPENDENT_AMBULATORY_CARE_PROVIDER_SITE_OTHER): Payer: Self-pay | Admitting: Obstetrics and Gynecology

## 2017-11-24 VITALS — BP 137/87 | HR 81 | Wt 159.0 lb

## 2017-11-24 DIAGNOSIS — N938 Other specified abnormal uterine and vaginal bleeding: Secondary | ICD-10-CM

## 2017-11-24 DIAGNOSIS — Z712 Person consulting for explanation of examination or test findings: Secondary | ICD-10-CM

## 2017-11-24 MED ORDER — NORGESTIMATE-ETH ESTRADIOL 0.25-35 MG-MCG PO TABS: 1.0000 | ORAL_TABLET | Freq: Every day | ORAL | 11 refills | Status: DC

## 2017-11-24 NOTE — Progress Notes (Signed)
Pt has been bleeding since October

## 2017-11-24 NOTE — Progress Notes (Signed)
32 yo [id number] here to discuss results of pelvic ultrasound. Patient reports persistent vaginal bleeding since October. She is using Paraguard IUD for contraception. She reports daily episodes of vaginal bleeding with some days as heavy like a period and others reduced to spotting. She denies any chest pain, shortness of breath, lightheadedness/dizziness  Past Medical History:  Diagnosis Date  . Abnormal Pap smear   . Anemia   . Migraine   . Supervision of normal pregnancy 07/01/2012   [place] (NT normal, first screen normal) MSAFP normal  Anatomic US   Glucose Screen   GBS   Feeding Preference Breast Feeding  Contraception   Circumcision        Past Surgical History:  Procedure Laterality Date  . TONSILLECTOMY  age 89   Family History  Problem Relation Age of Onset  . Cancer Maternal Grandmother        ovarian  . Cancer Paternal Grandmother        lung  . Cancer Paternal Grandfather        colon, prostate   Social History   Tobacco Use  . Smoking status: Former Smoker    Last attempt to quit: 11/10/2009    Years since quitting: 8.0  . Smokeless tobacco: Never Used  Substance Use Topics  . Alcohol use: No    Alcohol/week: 0.0 oz    Comment: rare   . Drug use: No   ROS See pertinent in HPI  Blood pressure 137/87, pulse 81, weight 159 lb (72.1 kg), not currently breastfeeding. GENERAL: Well-developed, well-nourished female in no acute distress.  NEURO: alert and oriented x3  10/2017 ultrasound FINDINGS: Uterus  Measurements: 10.3 x 5.2 x 6.1 cm. Heterogeneous echotexture. No focal measurable fibroids.  Endometrium  Thickness: 15 mm in thickness. IUD noted in expected position within the endometrial canal. No focal abnormality.  Right ovary  Measurements: 2.6 x 2.1 x 2.7 cm. Normal appearance/no adnexal mass.  Left ovary  Measurements: 2.7 x 2.3 x 2.5 cm. Normal appearance/no adnexal mass.  Other findings:  Trace  free fluid in the pelvis  IMPRESSION: Heterogeneous echotexture throughout the uterus. This can be seen with adenomyosis. No focal fibroid.  IUD in expected position within the endometrial canal.   Electronically Signed   By: Charlett NoseKevin  Dover M.D.   On: 10/30/2017 10:19   A/P 32 yo P3 with DUB - Reviewed ultrasound results with the patient - discussed medical management with OCP for a few months  - RTC if vaginal persist following at least 3 months of OCP or PRN - Patien tis due for pap smear 04/2018

## 2018-04-29 IMAGING — US US MFM OB COMP +14 WKS
1 series · 14 of 28 positions shown · non-contrast
Comparison: none

[Series 1: us mfm ob comp +14 wks · 14 of 83 slices shown]
[im 4/83]
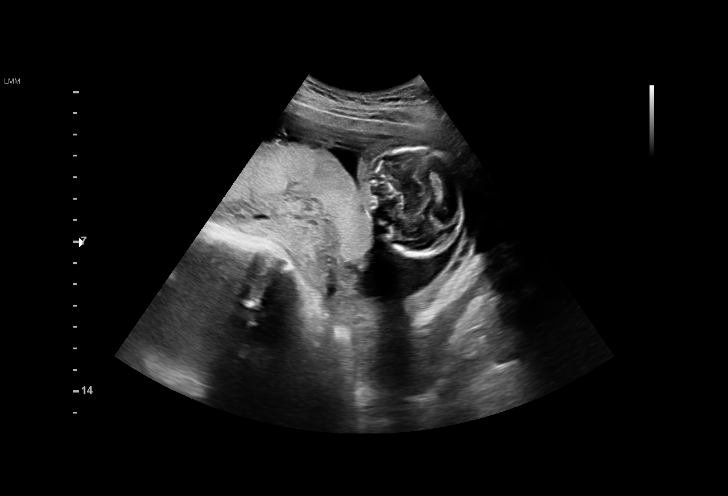
[im 10/83]
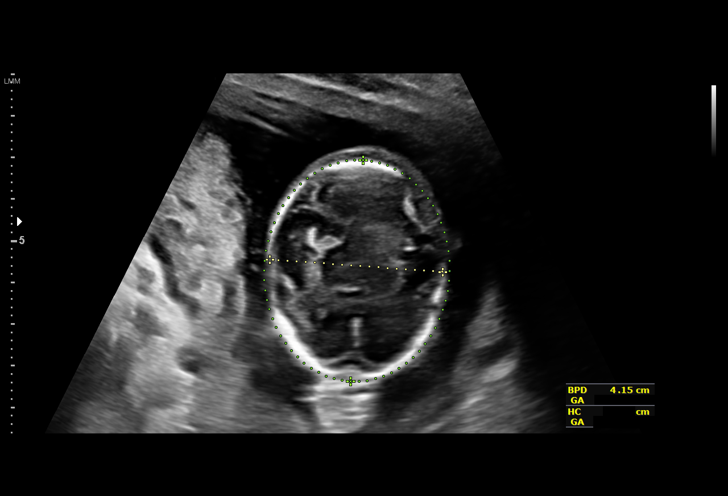
[im 16/83]
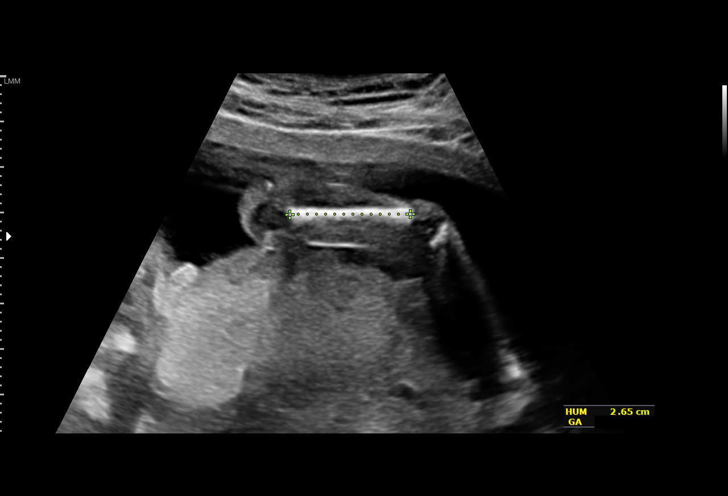
[im 22/83]
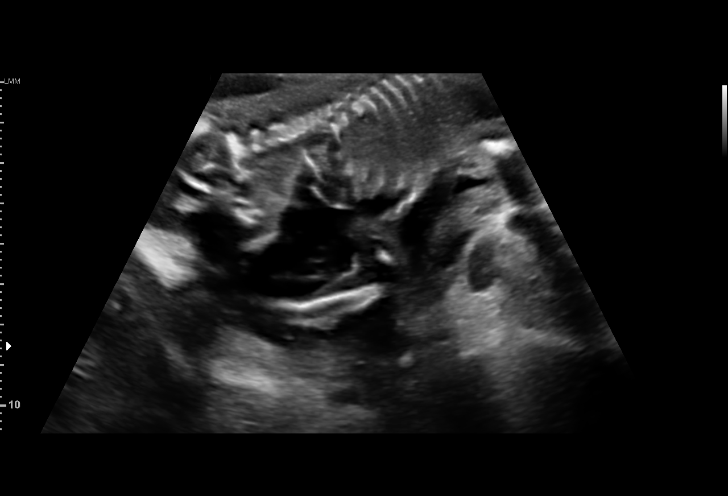
[im 28/83]
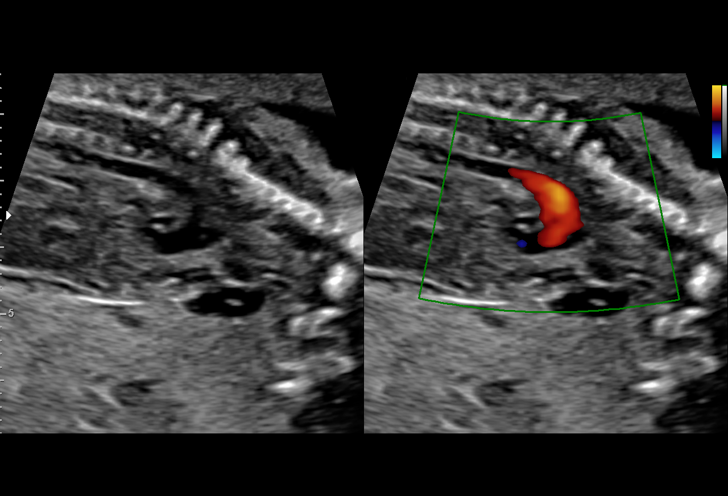
[im 34/83]
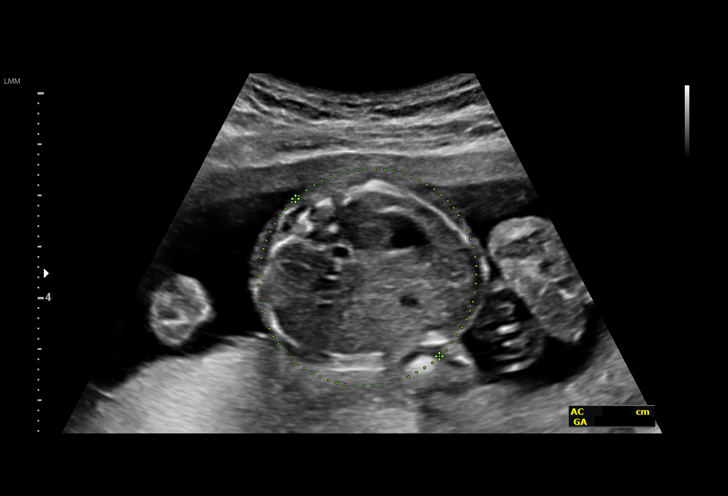
[im 40/83]
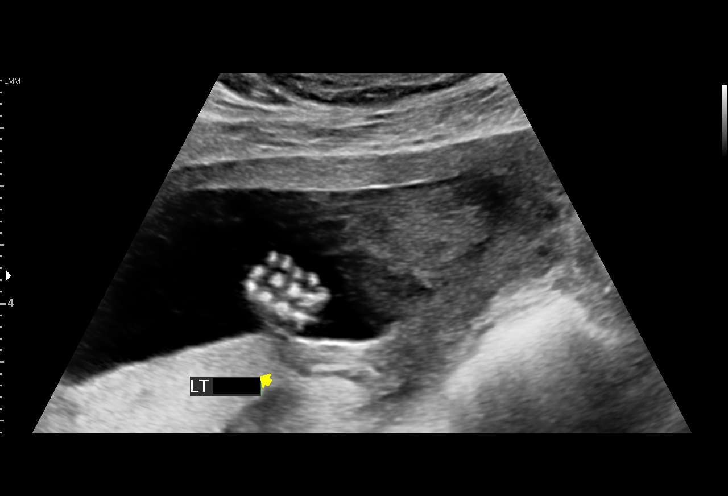
[im 46/83]
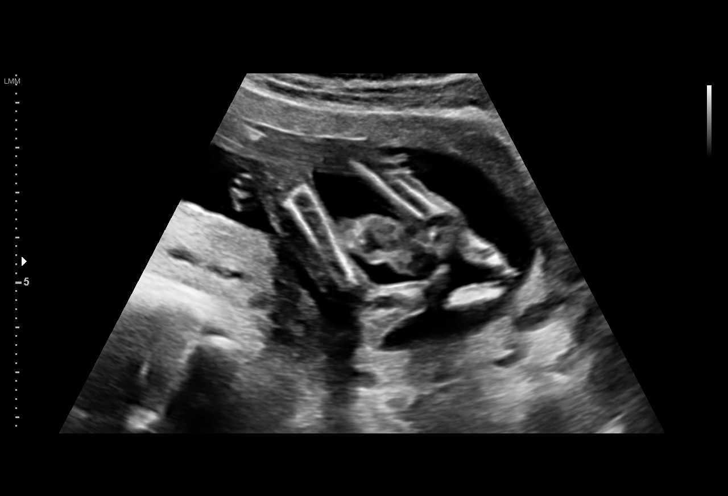
[im 52/83]
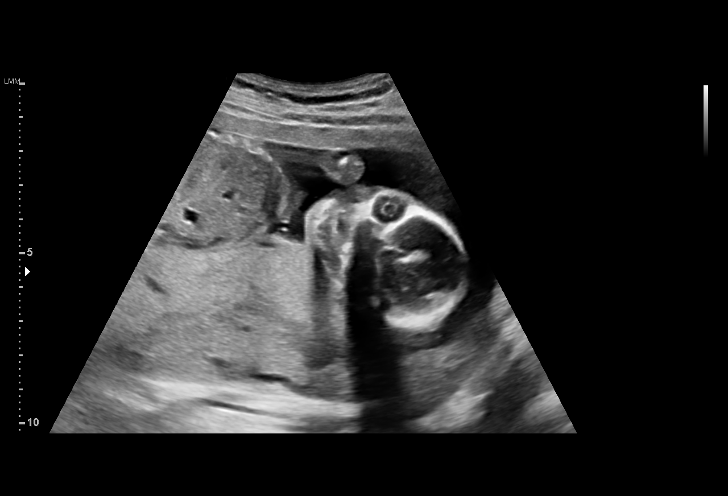
[im 58/83]
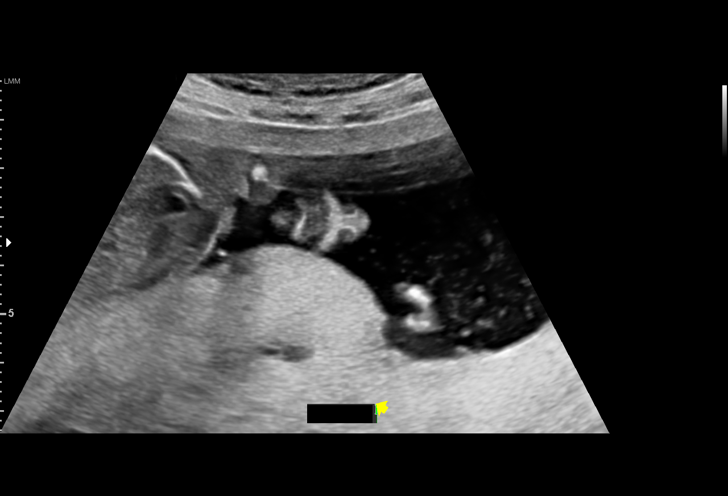
[im 64/83]
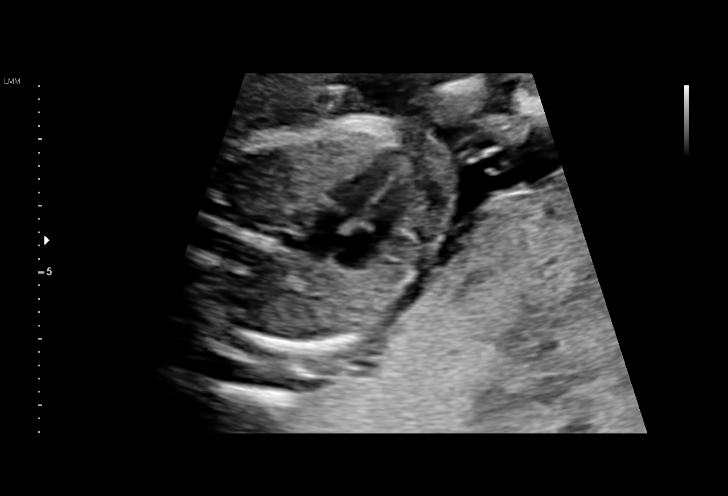
[im 70/83]
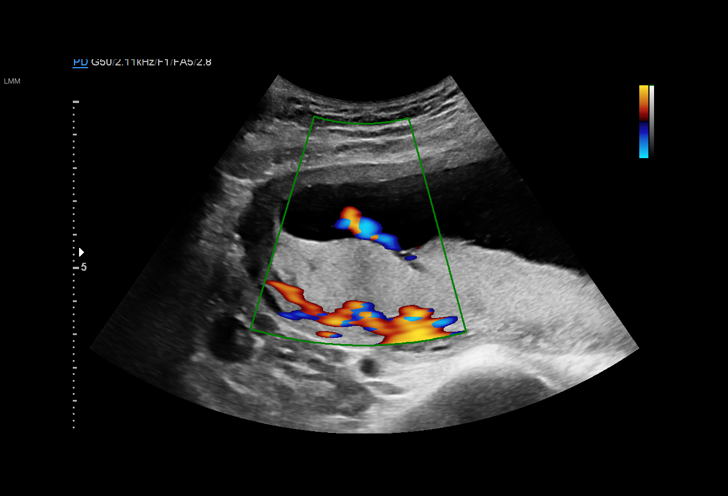
[im 76/83]
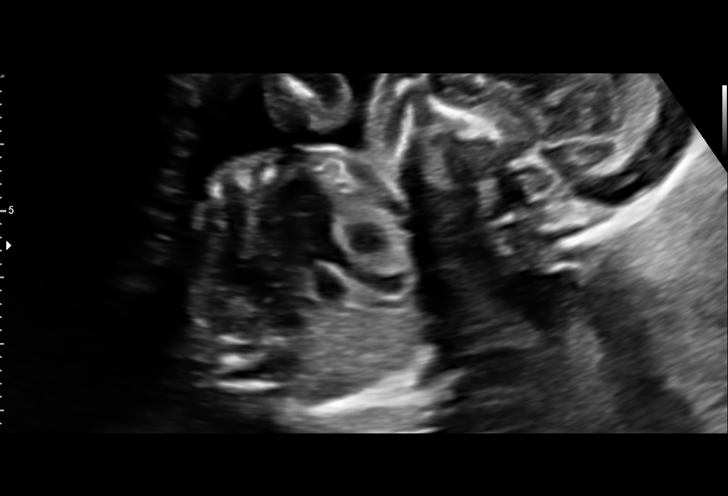
[im 83/83]
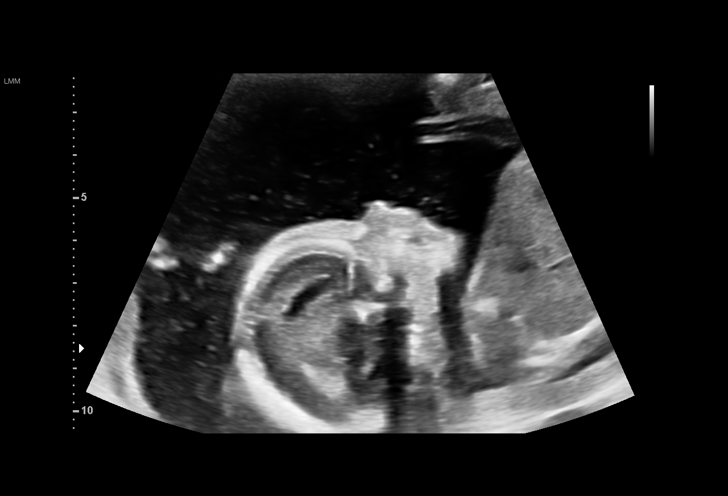

[14 of 28 positions shown; findings below may reference images not displayed]

[REDACTED]

Indications

19 weeks gestation of pregnancy
Encounter for antenatal screening for
malformations
OB History

Gravidity:    4         Term:   2
Living:       2
Fetal Evaluation

Num Of Fetuses:     1
Fetal Heart         151
Rate(bpm):
Cardiac Activity:   Observed
Presentation:       Variable
Placenta:           Anterior, above cervical os
P. Cord Insertion:  Visualized, central

Amniotic Fluid
AFI FV:      Subjectively within normal limits

Largest Pocket(cm)
4.36
Biometry

BPD:      42.1  mm     G. Age:  18w 5d         39  %    CI:        73.19   %   70 - 86
FL/HC:      18.2   %   16.1 -
HC:      156.4  mm     G. Age:  18w 4d         23  %    HC/AC:      1.15       1.09 -
AC:       136   mm     G. Age:  19w 0d         48  %    FL/BPD:     67.7   %
FL:       28.5  mm     G. Age:  18w 5d         35  %    FL/AC:      21.0   %   20 - 24
HUM:      27.7  mm     G. Age:  18w 6d         47  %
CER:      17.6  mm     G. Age:  17w 4d         14  %
NFT:       2.1  mm
CM:        4.9  mm

Est. FW:     262  gm      0 lb 9 oz     42  %
Gestational Age

LMP:           21w 0d       Date:   06/06/16                 EDD:   03/13/17
U/S Today:     18w 5d                                        EDD:   03/29/17
Best:          19w 0d    Det. By:   Early Ultrasound         EDD:   03/27/17
(08/27/16)
Anatomy

Cranium:               Appears normal         Aortic Arch:            Appears normal
Cavum:                 Appears normal         Ductal Arch:            Appears normal
Ventricles:            Appears normal         Diaphragm:              Appears normal
Choroid Plexus:        Appears normal         Stomach:                Appears normal, left
sided
Cerebellum:            Appears normal         Abdomen:                Appears normal
Posterior Fossa:       Appears normal         Abdominal Wall:         Appears nml (cord
insert, abd wall)
Nuchal Fold:           Appears normal         Cord Vessels:           Appears normal (3
vessel cord)
Face:                  Appears normal         Kidneys:                Appear normal
(orbits and profile)
Lips:                  Appears normal         Bladder:                Appears normal
Thoracic:              Appears normal         Spine:                  Appears normal
Heart:                 Appears normal         Upper Extremities:      Appears normal
(4CH, axis, and situs
RVOT:                  Appears normal         Lower Extremities:      Appears normal
LVOT:                  Appears normal

Other:  Fetus appears to be a female. Heels and 5th digit visualized.
Technically difficult due to fetal position.
Cervix Uterus Adnexa

Cervix
Length:           4.48  cm.
Normal appearance by transabdominal scan.

Uterus
No abnormality visualized.

Left Ovary
Not visualized.

Right Ovary
Not visualized.
Cul De Sac:   No free fluid seen.
Adnexa:       No abnormality visualized.
Impression

Singleton intrauterine pregnancy  19 weeks 6days on
attempted comprehensive fetal survey:

active singleton fetus
today's biometry demonstrates appropriate interval growth
with EFW at the 42nd%'le
no structural defects or markers of aneuploidy demonstrated
survey complete
Amniotic fluid volume is appropriate for gestational age by
maximum vertical pocket
placenta is implanted along the anterior uterine wall without
previa
Recommendations

Follow up as clinically indicated.

## 2018-06-13 IMAGING — US US MFM OB FOLLOW-UP
1 series · 14 of 28 positions shown · non-contrast
Comparison: none

[Series 1: us mfm ob follow-up · 14 of 53 slices shown]
[im 2/53]
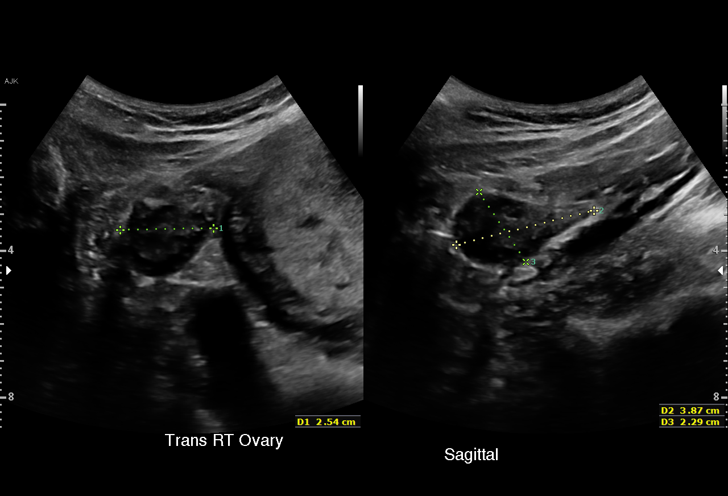
[im 6/53]
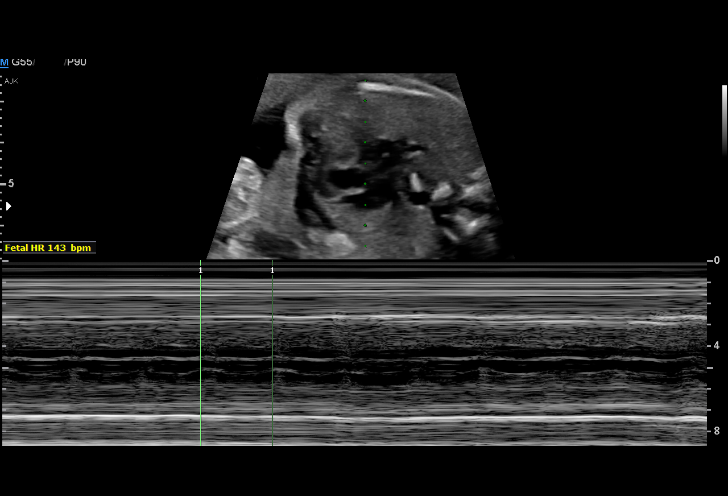
[im 10/53]
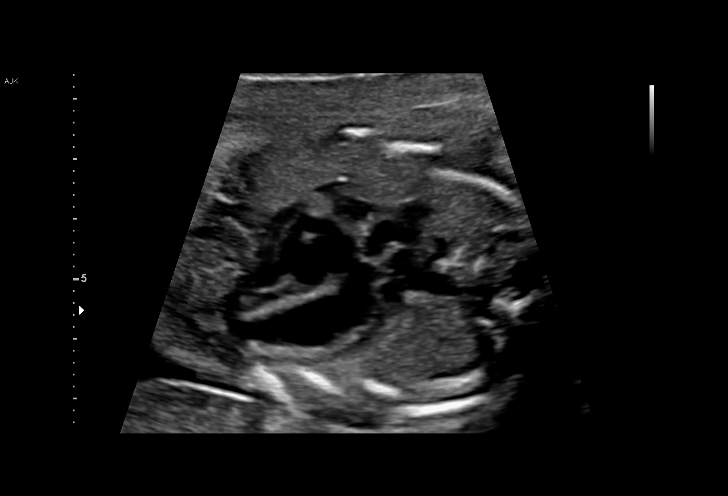
[im 14/53]
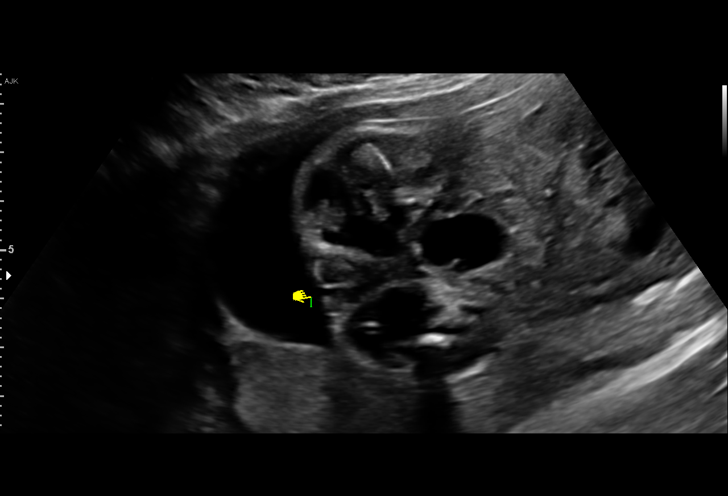
[im 18/53]
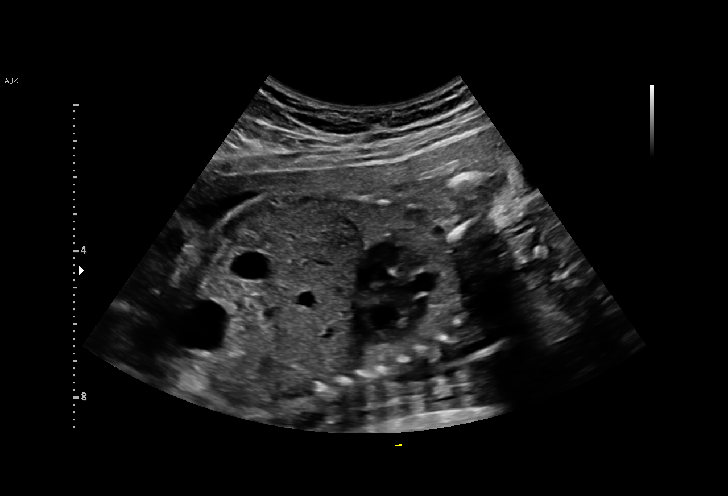
[im 22/53]
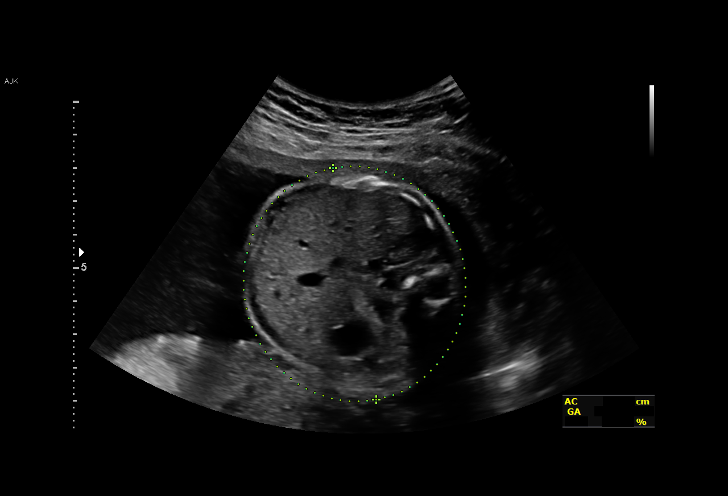
[im 26/53]
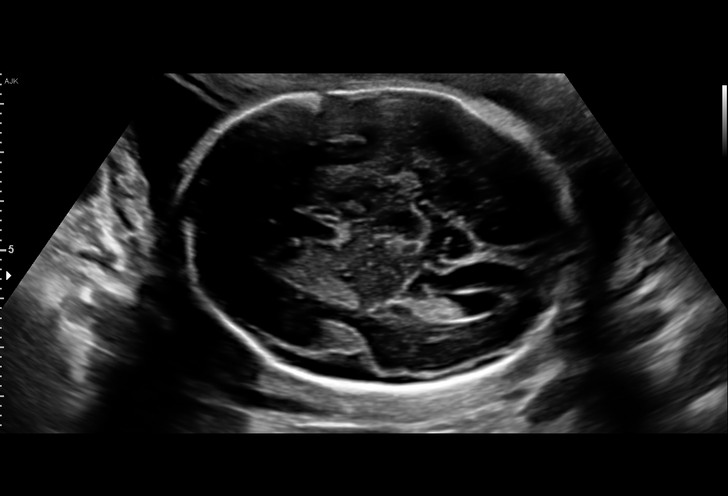
[im 29/53]
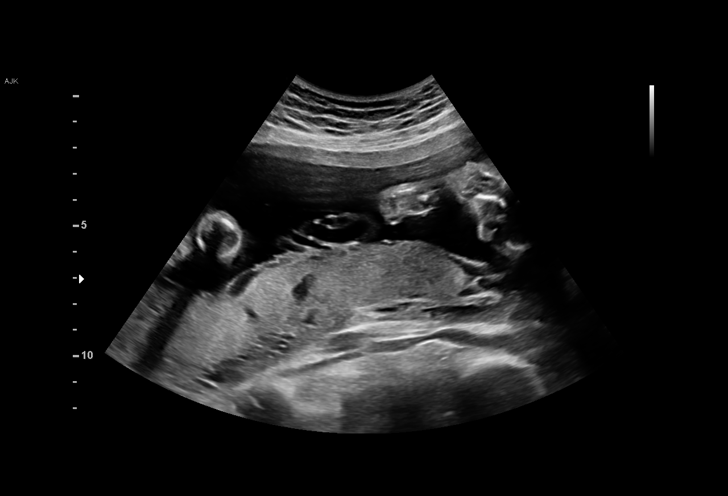
[im 33/53]
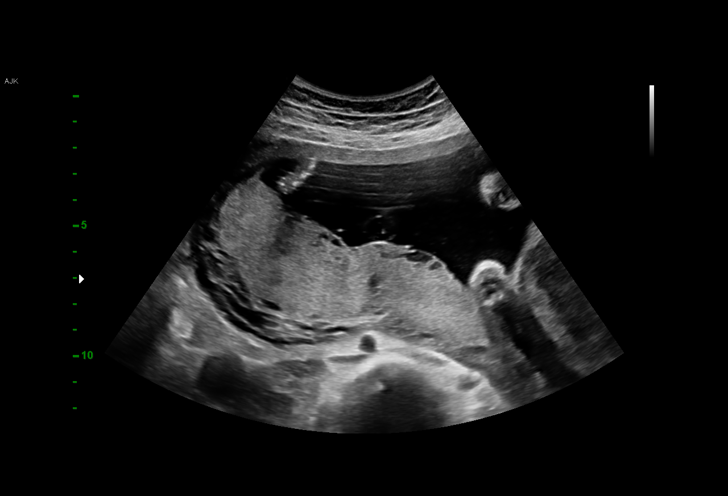
[im 37/53]
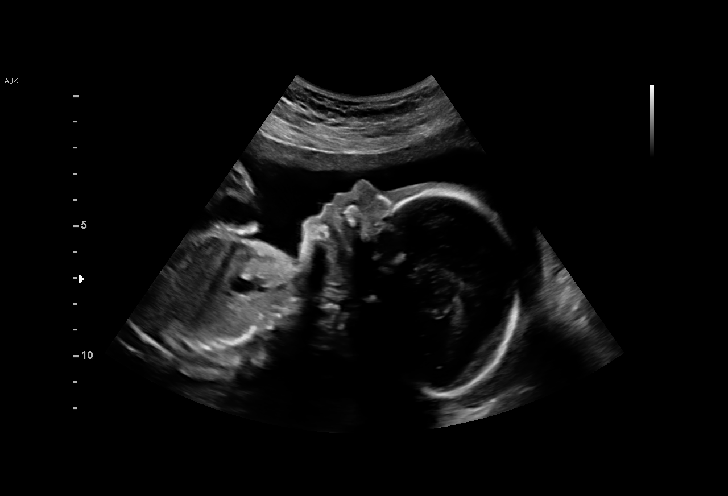
[im 41/53]
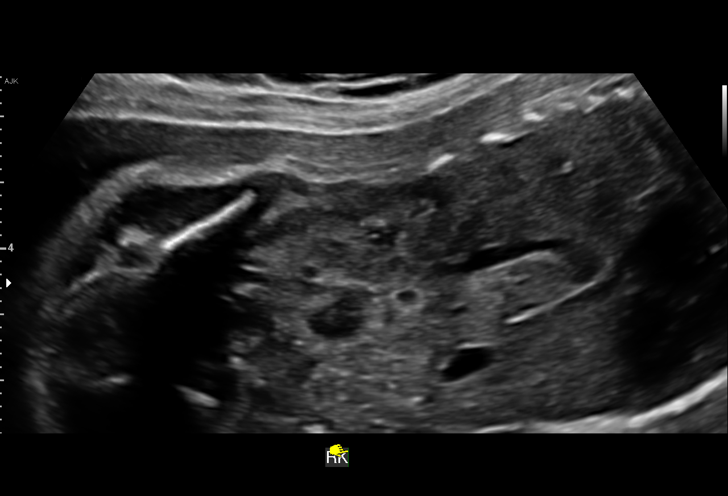
[im 45/53]
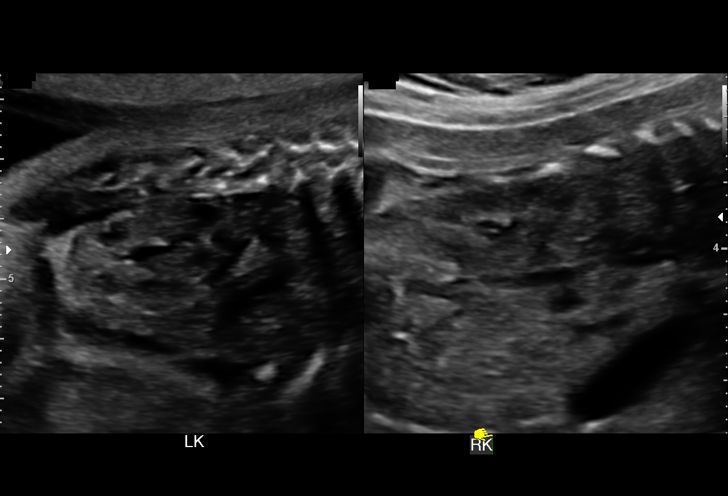
[im 49/53]
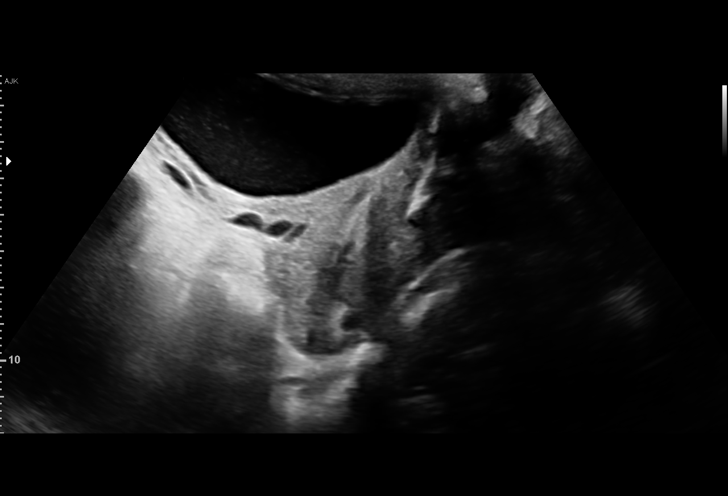
[im 53/53]
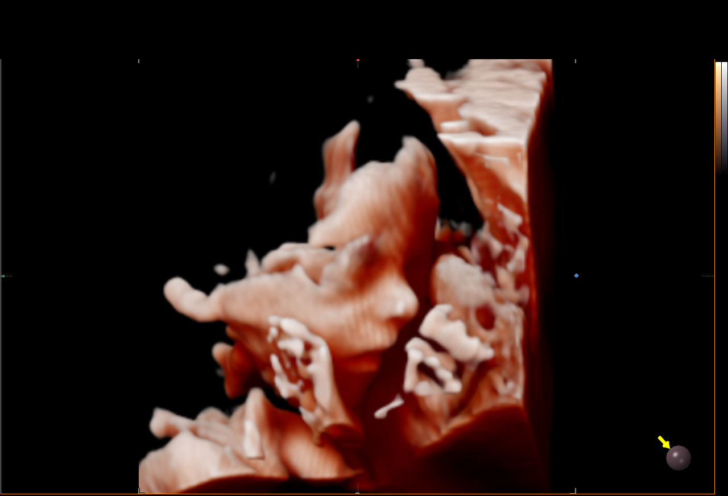

[14 of 28 positions shown; findings below may reference images not displayed]

[REDACTED]

1  [facility] STAT            [id number]      [id number]     [id number]
Indications

25 weeks gestation of pregnancy
Rh negative state in antepartum
Poor obstetric history: Previous
preeclampsia / eclampsia/gestational HTN
Gestational hypertension without significant
proteinuria, second trimester
OB History

Gravidity:    4         Term:   2
Living:       2
Fetal Evaluation

Num Of Fetuses:     1
Fetal Heart         143
Rate(bpm):
Cardiac Activity:   Observed
Presentation:       Cephalic
Placenta:           Posterior, above cervical os
P. Cord Insertion:  Visualized, central

Amniotic Fluid
AFI FV:      Subjectively within normal limits

Largest Pocket(cm)
4.35
Biometry

BPD:      61.2  mm     G. Age:  24w 6d         23  %    CI:        68.87   %    70 - 86
FL/HC:       19.7  %    18.7 -
HC:      235.6  mm     G. Age:  25w 4d         34  %    HC/AC:       1.10       1.04 -
AC:      213.5  mm     G. Age:  25w 6d         55  %    FL/BPD:      75.8  %    71 - 87
FL:       46.4  mm     G. Age:  25w 3d         37  %    FL/AC:       21.7  %    20 - 24

Est. FW:     832   gm    1 lb 13 oz     57  %
Gestational Age

LMP:           27w 3d        Date:  06/06/16                 EDD:   03/13/17
U/S Today:     25w 3d                                        EDD:   03/27/17
Best:          25w 3d     Det. By:  Early Ultrasound         EDD:   03/27/17
(08/27/16)
Anatomy

Cranium:               Appears normal         Aortic Arch:            Previously seen
Cavum:                 Appears normal         Ductal Arch:            Previously seen
Ventricles:            Appears normal         Diaphragm:              Appears normal
Choroid Plexus:        Previously seen        Stomach:                Appears normal, left
sided
Cerebellum:            Previously seen        Abdomen:                Appears normal
Posterior Fossa:       Previously seen        Abdominal Wall:         Previously seen
Nuchal Fold:           Previously seen        Cord Vessels:           Previously seen
Face:                  Orbits and profile     Kidneys:                Appear normal
previously seen
Lips:                  Appears normal         Bladder:                Appears normal
Thoracic:              Appears normal         Spine:                  Previously seen
Heart:                 Appears normal         Upper Extremities:      Previously seen
(4CH, axis, and situs
RVOT:                  Appears normal         Lower Extremities:      Previously seen
LVOT:                  Appears normal

Other:  Fetus appears to be a female. Heels and 5th digit previously seen.
Cervix Uterus Adnexa

Cervix
Length:            3.8  cm.
Normal appearance by transabdominal scan.

Uterus
No abnormality visualized.

Left Ovary
Within normal limits.

Right Ovary
Within normal limits.

Adnexa:       No abnormality visualized.
Impression

Single IUP at 25w 3d
Gestational hypertension
Fetal growth is appropriate (57th %tile)
Posterior placenta without previa
Normal amniotic fluid volume
Recommendations

Recommend follow up ultrasound for interval growth in 4
weeks (gestational hypertension)

## 2020-02-27 ENCOUNTER — Telehealth: Payer: Self-pay | Admitting: *Deleted

## 2020-02-27 NOTE — Telephone Encounter (Signed)
Returned call from 11:18 AM. Left patient a message to call office back.

## 2020-03-19 ENCOUNTER — Other Ambulatory Visit (HOSPITAL_COMMUNITY)
Admission: RE | Admit: 2020-03-19 | Discharge: 2020-03-19 | Disposition: A | Discharge status: Home / Self Care | Payer: Self-pay | Source: Ambulatory Visit | Attending: Obstetrics & Gynecology | Admitting: Obstetrics & Gynecology

## 2020-03-19 ENCOUNTER — Other Ambulatory Visit: Payer: Self-pay

## 2020-03-19 ENCOUNTER — Encounter: Payer: Self-pay | Admitting: Obstetrics & Gynecology

## 2020-03-19 ENCOUNTER — Ambulatory Visit: Payer: Self-pay | Admitting: Obstetrics & Gynecology

## 2020-03-19 VITALS — BP 137/93 | HR 83 | Resp 16 | Ht 65.0 in | Wt 133.0 lb

## 2020-03-19 DIAGNOSIS — Z01419 Encounter for gynecological examination (general) (routine) without abnormal findings: Secondary | ICD-10-CM

## 2020-03-19 DIAGNOSIS — Z975 Presence of (intrauterine) contraceptive device: Secondary | ICD-10-CM

## 2020-03-19 DIAGNOSIS — Z30432 Encounter for removal of intrauterine contraceptive device: Secondary | ICD-10-CM

## 2020-03-19 DIAGNOSIS — N921 Excessive and frequent menstruation with irregular cycle: Secondary | ICD-10-CM

## 2020-03-19 DIAGNOSIS — Z3202 Encounter for pregnancy test, result negative: Secondary | ICD-10-CM

## 2020-03-19 DIAGNOSIS — Z1272 Encounter for screening for malignant neoplasm of vagina: Secondary | ICD-10-CM

## 2020-03-19 LAB — POCT URINE PREGNANCY: Preg Test, Ur: NEGATIVE

## 2020-03-19 NOTE — Progress Notes (Addendum)
Subjective:     Meagan Campos is a 34 y.o. female here for a routine exam.  Current complaints: spotting with IUD.     Gynecologic History Patient's last menstrual period was 03/19/2020. Contraception: IUD and vasectomy Last Pap: 2018. Results were: normal Last mammogram: n/a.   Obstetric History OB History  Gravida Para Term Preterm AB Living  4 3 3   1 3   SAB TAB Ectopic Multiple Live Births        0 3    # Outcome Date GA Lbr Len/2nd Weight Sex Delivery Anes PTL Lv  4 Term 03/06/17 [redacted]w[redacted]d 03:10 / 00:11 7 lb 2.6 oz (3.25 kg) F Vag-Spont EPI  LIV  3 Term 01/22/13 100w1d 03:39 / 00:09  M Vag-Spont EPI  LIV  2 AB           1 Term  [redacted]w[redacted]d  7 lb 5 oz (3.317 kg) F Vag-Spont EPI  LIV   Uterus  Measurements: 10.3 x 5.2 x 6.1 cm. Heterogeneous echotexture. No focal measurable fibroids.  Endometrium  Thickness: 15 mm in thickness. IUD noted in expected position within the endometrial canal. No focal abnormality.  Right ovary  Measurements: 2.6 x 2.1 x 2.7 cm. Normal appearance/no adnexal mass.  Left ovary  Measurements: 2.7 x 2.3 x 2.5 cm. Normal appearance/no adnexal mass.  Other findings:  Trace free fluid in the pelvis  IMPRESSION: Heterogeneous echotexture throughout the uterus. This can be seen with adenomyosis. No focal fibroid.  IUD in expected position within the endometrial canal.  The following portions of the patient's history were reviewed and updated as appropriate: allergies, current medications, past family history, past medical history, past social history, past surgical history and problem list.  Review of Systems Pertinent items noted in HPI and remainder of comprehensive ROS otherwise negative.    Objective:      Vitals:   03/19/20 1555  BP: (!) 137/93  Pulse: 83  Resp: 16  Weight: 133 lb (60.3 kg)  Height: 5\' 5"  (1.651 m)   Vitals:  WNL General appearance: alert, cooperative and no distress  HEENT: Normocephalic, without  obvious abnormality, atraumatic Eyes: negative Throat: lips, mucosa, and tongue normal; teeth and gums normal  Respiratory: Clear to auscultation bilaterally  CV: Regular rate and rhythm  Breasts:  Normal appearance, no masses or tenderness, no nipple retraction or dimpling  GI: Soft, non-tender; bowel sounds normal; no masses,  no organomegaly  GU: External Genitalia:  Tanner V, no lesion Urethra:  No prolapse   Vagina: Pink, normal rugae, no blood or discharge  Cervix: No CMT, no lesion, IUD strings seen; small amt bleeding after pap  Uterus:  Normal size and contour, non tender  Adnexa: Normal, no masses, non tender  Musculoskeletal: No edema, redness or tenderness in the calves or thighs  Skin: No lesions or rash  Lymphatic: Axillary adenopathy: none     Psychiatric: Normal mood and behavior   IUD Removal Patient identified, informed consent performed. Discussed risks of irregular bleeding, cramping, infection, malpositioning or misplacement of the IUD outside the uterus which may require further procedures. Time out was performed. Speculum placed in the vagina. Cervix visualized. The strings of the IUD were grasped and pulled using ring forceps. The IUD was successfully removed in its entirety.      Assessment:    Healthy female exam.    Plan:   1.  Pap with co-testing 2.  Watch BP 3.  IUD removed.  If bleeding in  two weeks will try a course of doxycycline.  If bleeding still continues will get [place].   4.  Declines Covid vaccine at this time 5.  Vasectomy for contraception.  UPT negative today.

## 2020-03-20 LAB — CYTOLOGY - PAP
Comment: NEGATIVE
Diagnosis: NEGATIVE
High risk HPV: NEGATIVE

## 2023-03-16 ENCOUNTER — Telehealth: Payer: Self-pay | Admitting: *Deleted

## 2023-03-16 NOTE — Telephone Encounter (Signed)
Left patient a message with [facility] [employer] [facility] for appointment on 03/23/2023.

## 2023-03-17 ENCOUNTER — Encounter: Payer: Self-pay | Admitting: Obstetrics & Gynecology

## 2023-03-17 ENCOUNTER — Other Ambulatory Visit (HOSPITAL_COMMUNITY)
Admission: RE | Admit: 2023-03-17 | Discharge: 2023-03-17 | Disposition: A | Discharge status: Home / Self Care | Payer: Self-pay | Source: Ambulatory Visit | Attending: Obstetrics & Gynecology | Admitting: Obstetrics & Gynecology

## 2023-03-17 ENCOUNTER — Other Ambulatory Visit: Payer: Self-pay

## 2023-03-17 ENCOUNTER — Ambulatory Visit (INDEPENDENT_AMBULATORY_CARE_PROVIDER_SITE_OTHER): Payer: Self-pay | Admitting: Obstetrics & Gynecology

## 2023-03-17 VITALS — BP 171/94 | HR 87 | Wt 137.7 lb

## 2023-03-17 DIAGNOSIS — N762 Acute vulvitis: Secondary | ICD-10-CM | POA: Insufficient documentation

## 2023-03-17 MED ORDER — VALACYCLOVIR HCL 1 G PO TABS
1000.0000 mg | ORAL_TABLET | Freq: Two times a day (BID) | ORAL | 3 refills | Status: DC
Start: 2023-03-17 — End: 2023-09-14

## 2023-03-17 NOTE — Progress Notes (Signed)
Patient ID: Meagan Campos, female   DOB: [birth year], 37 y.o.   MRN: [id number]  [id number] irritation started after intercourse with her husband 3 days ago    HPI Meagan Campos is a 37 y.o. female.  [id number] No LMP recorded. She has had vulvar swelling irritation and bumps since having intercourse 3 days ago. Monogamous with her husband, no h/o any form of herpes  HPI  Past Medical History:  Diagnosis Date   Abnormal Pap smear    Anemia    Migraine    Supervision of normal pregnancy 07/01/2012   [name] Genetic Screen First Screen ordered (NT normal, first screen normal) MSAFP normal  Anatomic [place]   Glucose Screen   GBS   Feeding Preference Breast Feeding  Contraception   Circumcision         Past Surgical History:  Procedure Laterality Date   TONSILLECTOMY  age 5    Family History  Problem Relation Age of Onset   Cancer Maternal Grandmother        ovarian   Cancer Paternal Grandmother        lung   Cancer Paternal Grandfather        colon, prostate    Social History Social History   Tobacco Use   Smoking status: Former    Types: Cigarettes    Quit date: 11/10/2009    Years since quitting: 13.3   Smokeless tobacco: Never  Vaping Use   Vaping Use: Never used  Substance Use Topics   Alcohol use: No    Alcohol/week: 0.0 standard drinks of alcohol    Comment: rare    Drug use: No    No Known Allergies  Current Outpatient Medications  Medication Sig Dispense Refill   PARAGARD INTRAUTERINE COPPER IU by Intrauterine route.     No current facility-administered medications for this visit.    Review of Systems Review of Systems  Constitutional: Negative.   Respiratory: Negative.    Cardiovascular: Negative.   Gastrointestinal: Negative.   Genitourinary:  Positive for vaginal pain. Negative for menstrual problem, pelvic pain and vaginal discharge (vulvar sx as described).    Blood pressure (!) 143/89, pulse 84, weight 137 lb 11.2 oz (62.5 kg).  Physical  Exam Physical Exam Vitals and nursing note reviewed. Exam conducted with a chaperone present.  Constitutional:      Appearance: Normal appearance.  Genitourinary:      Comments: Areas indicate mild swelling and patches of herpetiform lesions.  Neurological:     Mental Status: She is alert.  Psychiatric:        Mood and Affect: Mood normal.        Behavior: Behavior normal.     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 Take for ten days.    Dispense:  20 tablet    Refill:  3    Information on herpes testing given.   Scheryl Darter 03/17/2023, 11:55 AM

## 2023-03-18 LAB — CERVICOVAGINAL ANCILLARY ONLY
Bacterial Vaginitis (gardnerella): NEGATIVE
Candida Glabrata: NEGATIVE
Candida Vaginitis: NEGATIVE
Chlamydia: NEGATIVE
Comment: NEGATIVE
Comment: NEGATIVE
Comment: NEGATIVE
Comment: NEGATIVE
Comment: NEGATIVE
Comment: NORMAL
Neisseria Gonorrhea: NEGATIVE
Trichomonas: NEGATIVE

## 2023-03-19 LAB — HERPES SIMPLEX VIRUS CULTURE

## 2023-03-23 ENCOUNTER — Ambulatory Visit: Payer: Self-pay | Admitting: Obstetrics & Gynecology

## 2023-09-14 ENCOUNTER — Encounter: Payer: Self-pay | Admitting: Obstetrics & Gynecology

## 2023-09-14 ENCOUNTER — Other Ambulatory Visit (HOSPITAL_COMMUNITY)
Admission: RE | Admit: 2023-09-14 | Discharge: 2023-09-14 | Disposition: A | Discharge status: Home / Self Care | Payer: Self-pay | Source: Ambulatory Visit | Attending: Obstetrics & Gynecology | Admitting: Obstetrics & Gynecology

## 2023-09-14 ENCOUNTER — Ambulatory Visit (INDEPENDENT_AMBULATORY_CARE_PROVIDER_SITE_OTHER): Payer: Self-pay | Admitting: Obstetrics & Gynecology

## 2023-09-14 VITALS — BP 146/90 | HR 79 | Resp 16 | Ht 65.0 in | Wt 140.0 lb

## 2023-09-14 DIAGNOSIS — R030 Elevated blood-pressure reading, without diagnosis of hypertension: Secondary | ICD-10-CM | POA: Insufficient documentation

## 2023-09-14 DIAGNOSIS — Z01419 Encounter for gynecological examination (general) (routine) without abnormal findings: Secondary | ICD-10-CM | POA: Insufficient documentation

## 2023-09-14 DIAGNOSIS — A609 Anogenital herpesviral infection, unspecified: Secondary | ICD-10-CM | POA: Insufficient documentation

## 2023-09-14 NOTE — Progress Notes (Signed)
  Subjective:     Meagan Campos is a 37 y.o. female here for a routine exam.  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  Most likely this was oral/vaginal transmission.  We did discuss the efficacy of doing STD screening today which she agrees to..     Gynecologic History Patient's last menstrual period was 09/14/2023. Contraception: vasectomy Last Mammogram: n/a Last Pap Smear:  03/19/20- neg Last Colon Screening;  n/a Seat Belts:   yes Sun Screen:   yes Dental Check Up:  yes Brush & Floss:  yes  Obstetric History OB History  Gravida Para Term Preterm AB Living  4 3 3   1 3   SAB IAB Ectopic Multiple Live Births        0 3    # Outcome Date GA Lbr Len/2nd Weight Sex Type Anes PTL Lv  4 Term 03/06/17 [redacted]w[redacted]d 03:10 / 00:11 7 lb 2.6 oz (3.25 kg) F Vag-Spont EPI  LIV  3 Term 01/22/13 [redacted]w[redacted]d 03:39 / 00:09  M Vag-Spont EPI  LIV  2 AB           1 Term  [redacted]w[redacted]d  7 lb 5 oz (3.317 kg) F Vag-Spont EPI  LIV     The following portions of the patient's history were reviewed and updated as appropriate: allergies, current medications, past family history, past medical history, past social history, past surgical history, and problem list.  Review of Systems Pertinent items noted in HPI and remainder of comprehensive ROS otherwise negative.    Objective:     Vitals:   09/14/23 1100  BP: (!) 146/90  Pulse: 79  Resp: 16  Weight: 140 lb (63.5 kg)  Height: 5\' 5"  (1.651 m)   Vitals:  WNL General appearance: alert, cooperative and no distress  HEENT: Normocephalic, without obvious abnormality, atraumatic Eyes: negative Throat: lips, mucosa, and tongue normal; teeth and gums normal  Respiratory: Clear to auscultation bilaterally  CV: Regular rate and rhythm  Breasts:  Normal appearance, no masses or tenderness, no nipple retraction or dimpling  GI: Soft, non-tender; bowel sounds normal; no masses,  no organomegaly  GU: External Genitalia:  Tanner V, no  lesion Urethra:  No prolapse   Vagina: Pink, normal rugae, no blood or discharge  Cervix: No CMT, no lesion  Uterus:  Feels slightly enlarge and possibly deviated to the left vs left adnexal mass--exam difficult to assess;non tender  Adnexa: Normal, no masses, non tender  Musculoskeletal: No edema, redness or tenderness in the calves or thighs  Skin: No lesions or rash  Lymphatic: Axillary adenopathy: none     Psychiatric: Normal mood and behavior        Assessment:    Healthy female exam.    Plan:    Pap with cotesting STD screening Pelvic US complete for slightly enlarged uterus on exam.   4.  Pt believes she had BRCA screening many ears ago with us--RN will look.  MGM died ovarian cancer.  5.  BP elevated--pt asked to take BP at home and report them to [place].

## 2023-09-15 LAB — HEPATITIS B SURFACE ANTIGEN: Hepatitis B Surface Ag: NEGATIVE

## 2023-09-15 LAB — RPR: RPR Ser Ql: NONREACTIVE

## 2023-09-15 LAB — HIV ANTIBODY (ROUTINE TESTING W REFLEX): HIV Screen 4th Generation wRfx: NONREACTIVE

## 2023-09-15 LAB — HEPATITIS C ANTIBODY: Hep C Virus Ab: NONREACTIVE

## 2023-09-16 ENCOUNTER — Ambulatory Visit (INDEPENDENT_AMBULATORY_CARE_PROVIDER_SITE_OTHER): Payer: Self-pay

## 2023-09-16 DIAGNOSIS — Z01419 Encounter for gynecological examination (general) (routine) without abnormal findings: Secondary | ICD-10-CM

## 2023-09-16 DIAGNOSIS — N852 Hypertrophy of uterus: Secondary | ICD-10-CM

## 2023-09-18 ENCOUNTER — Other Ambulatory Visit: Payer: Self-pay | Admitting: Medical Genetics

## 2023-09-18 DIAGNOSIS — Z006 Encounter for examination for normal comparison and control in clinical research program: Secondary | ICD-10-CM

## 2023-09-18 LAB — CYTOLOGY - PAP
Chlamydia: NEGATIVE
Comment: NEGATIVE
Comment: NEGATIVE
Comment: NORMAL
Diagnosis: UNDETERMINED — AB
High risk HPV: NEGATIVE
Neisseria Gonorrhea: NEGATIVE

## 2023-10-16 ENCOUNTER — Other Ambulatory Visit (HOSPITAL_COMMUNITY)
Admission: RE | Admit: 2023-10-16 | Discharge: 2023-10-16 | Disposition: A | Discharge status: Home / Self Care | Payer: Self-pay | Source: Ambulatory Visit | Attending: Medical Genetics | Admitting: Medical Genetics

## 2023-10-16 DIAGNOSIS — Z006 Encounter for examination for normal comparison and control in clinical research program: Secondary | ICD-10-CM | POA: Insufficient documentation

## 2023-10-26 LAB — GENECONNECT MOLECULAR SCREEN: Genetic Analysis Overall Interpretation: NEGATIVE
